# Patient Record
Sex: Female | Born: 1955 | Race: White | Hispanic: No | Marital: Married | Smoking: Never smoker
Health system: Southern US, Community
[De-identification: ages and names within clinical notes are randomized; demographics above are authoritative.]

## PROBLEM LIST (undated history)

## (undated) DIAGNOSIS — E119 Type 2 diabetes mellitus without complications: Secondary | ICD-10-CM

## (undated) DIAGNOSIS — E785 Hyperlipidemia, unspecified: Secondary | ICD-10-CM

## (undated) DIAGNOSIS — K219 Gastro-esophageal reflux disease without esophagitis: Secondary | ICD-10-CM

## (undated) DIAGNOSIS — G4730 Sleep apnea, unspecified: Secondary | ICD-10-CM

## (undated) DIAGNOSIS — H8109 Meniere's disease, unspecified ear: Secondary | ICD-10-CM

## (undated) DIAGNOSIS — M1990 Unspecified osteoarthritis, unspecified site: Secondary | ICD-10-CM

## (undated) DIAGNOSIS — J45909 Unspecified asthma, uncomplicated: Secondary | ICD-10-CM

## (undated) HISTORY — PX: WRIST ARTHROSCOPY: SHX838

## (undated) HISTORY — PX: BREAST EXCISIONAL BIOPSY: SUR124

## (undated) HISTORY — PX: BREAST SURGERY: SHX581

## (undated) HISTORY — PX: CHOLECYSTECTOMY: SHX55

## (undated) HISTORY — PX: ANKLE FRACTURE SURGERY: SHX122

## (undated) HISTORY — DX: Meniere's disease, unspecified ear: H81.09

## (undated) HISTORY — PX: ABDOMINAL HYSTERECTOMY: SHX81

## (undated) HISTORY — PX: TONSILLECTOMY: SUR1361

## (undated) HISTORY — DX: Hyperlipidemia, unspecified: E78.5

---

## 2004-06-03 ENCOUNTER — Ambulatory Visit: Payer: Self-pay | Admitting: Family Medicine

## 2004-09-28 ENCOUNTER — Ambulatory Visit: Payer: Self-pay | Admitting: Obstetrics and Gynecology

## 2005-08-02 ENCOUNTER — Ambulatory Visit: Payer: Self-pay | Admitting: Unknown Physician Specialty

## 2006-08-04 ENCOUNTER — Ambulatory Visit: Payer: Self-pay | Admitting: Unknown Physician Specialty

## 2007-11-21 ENCOUNTER — Ambulatory Visit: Payer: Self-pay | Admitting: Unknown Physician Specialty

## 2008-06-04 ENCOUNTER — Ambulatory Visit: Payer: Self-pay | Admitting: Unknown Physician Specialty

## 2008-11-26 ENCOUNTER — Ambulatory Visit: Payer: Self-pay | Admitting: Unknown Physician Specialty

## 2009-11-18 ENCOUNTER — Ambulatory Visit: Payer: Self-pay | Admitting: Unknown Physician Specialty

## 2009-11-27 ENCOUNTER — Ambulatory Visit: Payer: Self-pay | Admitting: Unknown Physician Specialty

## 2009-12-10 ENCOUNTER — Ambulatory Visit: Payer: Self-pay | Admitting: Unknown Physician Specialty

## 2010-01-09 ENCOUNTER — Ambulatory Visit: Payer: Self-pay | Admitting: Unknown Physician Specialty

## 2010-02-09 ENCOUNTER — Ambulatory Visit: Payer: Self-pay | Admitting: Unknown Physician Specialty

## 2010-04-15 ENCOUNTER — Ambulatory Visit: Payer: Self-pay | Admitting: Unknown Physician Specialty

## 2010-04-22 ENCOUNTER — Ambulatory Visit: Payer: Self-pay

## 2010-04-29 ENCOUNTER — Ambulatory Visit: Payer: Self-pay | Admitting: Surgery

## 2010-05-21 ENCOUNTER — Ambulatory Visit: Payer: Self-pay | Admitting: Surgery

## 2010-05-28 ENCOUNTER — Ambulatory Visit: Payer: Self-pay | Admitting: Surgery

## 2010-11-02 ENCOUNTER — Ambulatory Visit: Payer: Self-pay | Admitting: Gastroenterology

## 2010-11-02 LAB — HM COLONOSCOPY

## 2010-11-30 ENCOUNTER — Ambulatory Visit: Payer: Self-pay | Admitting: Unknown Physician Specialty

## 2011-05-24 IMAGING — NM NUCLEAR MEDICINE HEPATOHBILIARY INCLUDE GB
3 series · 21 of 21 positions shown · non-contrast
Comparison: none

REASON FOR EXAM: abd pain
COMMENTS:

[Series 1000: gallbladder statics · 4.80mm/px · 9 of 9 slices shown]
[im 1/9]
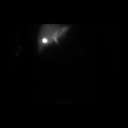
[im 2/9]
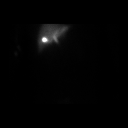
[im 3/9]
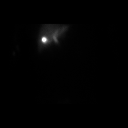
[im 4/9]
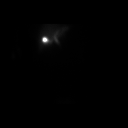
[im 5/9]
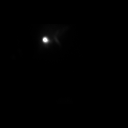
[im 6/9]
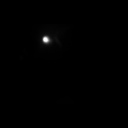
[im 7/9]
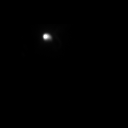
[im 8/9]
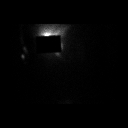
[im 9/9]
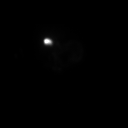

[Series 1000: gallbladder dynamic (results) · 4.80mm/px · 6 of 60 frames shown]
[frame 6/60]
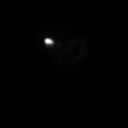
[frame 16/60]
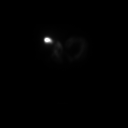
[frame 26/60]
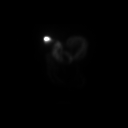
[frame 36/60]
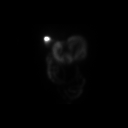
[frame 46/60]
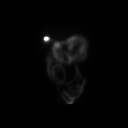
[frame 56/60]
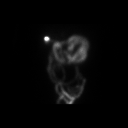

[Series 1000: gallbladder dynamic · 4.80mm/px · 6 of 60 frames shown]
[frame 6/60]
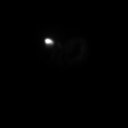
[frame 16/60]
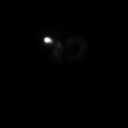
[frame 26/60]
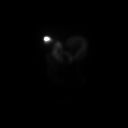
[frame 36/60]
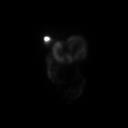
[frame 46/60]
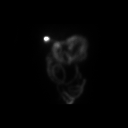
[frame 56/60]
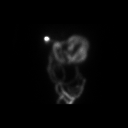

[21 of 21 positions shown; findings below may reference images not displayed]

PROCEDURE:     NM  - NM HEPATO WITH GB EJECT FRACTION  - April 29, 2010 [DATE]

RESULT:     Following intravenous administration of 8.16 mCi technetium-66m
Choletec, there is noted prompt visualization of tracer activity in the
liver at 8 minutes. Tracer activity is visualized in the gallbladder, common
duct and small bowel at 72 minutes. The gallbladder ejection fraction
measures 92%, which is in the normal range.
IMPRESSION: 1. Normal hepatobiliary scan.
2. The gallbladder ejection fraction measures 92% which is in the normal
range.

## 2011-12-01 ENCOUNTER — Ambulatory Visit: Payer: Self-pay | Admitting: Unknown Physician Specialty

## 2012-08-07 ENCOUNTER — Ambulatory Visit: Payer: Self-pay | Admitting: Unknown Physician Specialty

## 2012-08-12 ENCOUNTER — Ambulatory Visit: Payer: Self-pay | Admitting: Unknown Physician Specialty

## 2017-08-10 ENCOUNTER — Other Ambulatory Visit: Payer: Self-pay

## 2017-08-10 ENCOUNTER — Observation Stay
Admission: EM | Admit: 2017-08-10 | Discharge: 2017-08-11 | DRG: 638 | Disposition: A | Discharge status: Home / Self Care | Payer: No Typology Code available for payment source | Attending: Specialist | Admitting: Specialist

## 2017-08-10 ENCOUNTER — Encounter: Payer: Self-pay | Admitting: Emergency Medicine

## 2017-08-10 DIAGNOSIS — Z794 Long term (current) use of insulin: Secondary | ICD-10-CM

## 2017-08-10 DIAGNOSIS — R42 Dizziness and giddiness: Secondary | ICD-10-CM | POA: Diagnosis not present

## 2017-08-10 DIAGNOSIS — E871 Hypo-osmolality and hyponatremia: Secondary | ICD-10-CM | POA: Diagnosis present

## 2017-08-10 DIAGNOSIS — Z91120 Patient's intentional underdosing of medication regimen due to financial hardship: Secondary | ICD-10-CM

## 2017-08-10 DIAGNOSIS — Z9049 Acquired absence of other specified parts of digestive tract: Secondary | ICD-10-CM | POA: Diagnosis not present

## 2017-08-10 DIAGNOSIS — E119 Type 2 diabetes mellitus without complications: Secondary | ICD-10-CM

## 2017-08-10 DIAGNOSIS — T383X6A Underdosing of insulin and oral hypoglycemic [antidiabetic] drugs, initial encounter: Secondary | ICD-10-CM | POA: Diagnosis present

## 2017-08-10 DIAGNOSIS — Z90710 Acquired absence of both cervix and uterus: Secondary | ICD-10-CM | POA: Diagnosis not present

## 2017-08-10 DIAGNOSIS — Z801 Family history of malignant neoplasm of trachea, bronchus and lung: Secondary | ICD-10-CM

## 2017-08-10 DIAGNOSIS — E663 Overweight: Secondary | ICD-10-CM | POA: Diagnosis present

## 2017-08-10 DIAGNOSIS — Z6825 Body mass index (BMI) 25.0-25.9, adult: Secondary | ICD-10-CM

## 2017-08-10 DIAGNOSIS — E860 Dehydration: Secondary | ICD-10-CM | POA: Diagnosis present

## 2017-08-10 DIAGNOSIS — E785 Hyperlipidemia, unspecified: Secondary | ICD-10-CM | POA: Diagnosis not present

## 2017-08-10 DIAGNOSIS — R55 Syncope and collapse: Secondary | ICD-10-CM | POA: Diagnosis present

## 2017-08-10 DIAGNOSIS — E1010 Type 1 diabetes mellitus with ketoacidosis without coma: Principal | ICD-10-CM | POA: Diagnosis present

## 2017-08-10 DIAGNOSIS — E1110 Type 2 diabetes mellitus with ketoacidosis without coma: Secondary | ICD-10-CM | POA: Diagnosis present

## 2017-08-10 HISTORY — DX: Type 2 diabetes mellitus without complications: E11.9

## 2017-08-10 LAB — COMPREHENSIVE METABOLIC PANEL
ALT: 31 U/L (ref 14–54)
AST: 30 U/L (ref 15–41)
Albumin: 4.4 g/dL (ref 3.5–5.0)
Alkaline Phosphatase: 122 U/L (ref 38–126)
Anion gap: 21 — ABNORMAL HIGH (ref 5–15)
BILIRUBIN TOTAL: 1.3 mg/dL — AB (ref 0.3–1.2)
BUN: 23 mg/dL — ABNORMAL HIGH (ref 6–20)
CALCIUM: 9.9 mg/dL (ref 8.9–10.3)
CO2: 13 mmol/L — ABNORMAL LOW (ref 22–32)
CREATININE: 0.95 mg/dL (ref 0.44–1.00)
Chloride: 98 mmol/L — ABNORMAL LOW (ref 101–111)
Glucose, Bld: 496 mg/dL — ABNORMAL HIGH (ref 65–99)
Potassium: 4.8 mmol/L (ref 3.5–5.1)
Sodium: 132 mmol/L — ABNORMAL LOW (ref 135–145)
TOTAL PROTEIN: 8.1 g/dL (ref 6.5–8.1)

## 2017-08-10 LAB — CBC WITH DIFFERENTIAL/PLATELET
BASOS PCT: 1 %
Basophils Absolute: 0.1 10*3/uL (ref 0–0.1)
EOS ABS: 0.1 10*3/uL (ref 0–0.7)
Eosinophils Relative: 1 %
HCT: 45.9 % (ref 35.0–47.0)
HEMOGLOBIN: 15 g/dL (ref 12.0–16.0)
Lymphocytes Relative: 22 %
Lymphs Abs: 1.8 10*3/uL (ref 1.0–3.6)
MCH: 30.6 pg (ref 26.0–34.0)
MCHC: 32.7 g/dL (ref 32.0–36.0)
MCV: 93.6 fL (ref 80.0–100.0)
MONOS PCT: 6 %
Monocytes Absolute: 0.5 10*3/uL (ref 0.2–0.9)
NEUTROS PCT: 70 %
Neutro Abs: 5.9 10*3/uL (ref 1.4–6.5)
Platelets: 288 10*3/uL (ref 150–440)
RBC: 4.91 MIL/uL (ref 3.80–5.20)
RDW: 13.7 % (ref 11.5–14.5)
WBC: 8.4 10*3/uL (ref 3.6–11.0)

## 2017-08-10 LAB — URINALYSIS, COMPLETE (UACMP) WITH MICROSCOPIC
Bacteria, UA: NONE SEEN
Bilirubin Urine: NEGATIVE
Hgb urine dipstick: NEGATIVE
Ketones, ur: 80 mg/dL — AB
Leukocytes, UA: NEGATIVE
Nitrite: NEGATIVE
PH: 5 (ref 5.0–8.0)
PROTEIN: NEGATIVE mg/dL
RBC / HPF: NONE SEEN RBC/hpf (ref 0–5)
SPECIFIC GRAVITY, URINE: 1.025 (ref 1.005–1.030)

## 2017-08-10 LAB — BASIC METABOLIC PANEL
Anion gap: 11 (ref 5–15)
Anion gap: 5 (ref 5–15)
Anion gap: 10 (ref 5–15)
BUN: 17 mg/dL (ref 6–20)
BUN: 13 mg/dL (ref 6–20)
BUN: 13 mg/dL (ref 6–20)
CO2: 14 mmol/L — ABNORMAL LOW (ref 22–32)
CO2: 18 mmol/L — ABNORMAL LOW (ref 22–32)
CO2: 17 mmol/L — ABNORMAL LOW (ref 22–32)
Calcium: 8.3 mg/dL — ABNORMAL LOW (ref 8.9–10.3)
Calcium: 8.2 mg/dL — ABNORMAL LOW (ref 8.9–10.3)
Calcium: 8.5 mg/dL — ABNORMAL LOW (ref 8.9–10.3)
Chloride: 107 mmol/L (ref 101–111)
Chloride: 111 mmol/L (ref 101–111)
Chloride: 110 mmol/L (ref 101–111)
Creatinine, Ser: 0.78 mg/dL (ref 0.44–1.00)
Creatinine, Ser: 0.63 mg/dL (ref 0.44–1.00)
Creatinine, Ser: 0.75 mg/dL (ref 0.44–1.00)
GFR calc Af Amer: >60 mL/min (ref >60)
GFR calc Af Amer: >60 mL/min (ref >60)
GFR calc Af Amer: >60 mL/min (ref >60)
GFR calc non Af Amer: >60 mL/min (ref >60)
GFR calc non Af Amer: >60 mL/min (ref >60)
GFR calc non Af Amer: >60 mL/min (ref >60)
Glucose, Bld: 249 mg/dL — ABNORMAL HIGH (ref 65–99)
Glucose, Bld: 149 mg/dL — ABNORMAL HIGH (ref 65–99)
Glucose, Bld: 346 mg/dL — ABNORMAL HIGH (ref 65–99)
Potassium: 5 mmol/L (ref 3.5–5.1)
Potassium: 4 mmol/L (ref 3.5–5.1)
Potassium: 4.6 mmol/L (ref 3.5–5.1)
Sodium: 132 mmol/L — ABNORMAL LOW (ref 135–145)
Sodium: 134 mmol/L — ABNORMAL LOW (ref 135–145)
Sodium: 137 mmol/L (ref 135–145)

## 2017-08-10 LAB — GLUCOSE, CAPILLARY
GLUCOSE-CAPILLARY: 288 mg/dL — AB (ref 65–99)
Glucose-Capillary: 371 mg/dL — ABNORMAL HIGH (ref 65–99)
Glucose-Capillary: 263 mg/dL — ABNORMAL HIGH (ref 65–99)
Glucose-Capillary: 263 mg/dL — ABNORMAL HIGH (ref 65–99)
Glucose-Capillary: 208 mg/dL — ABNORMAL HIGH (ref 65–99)
Glucose-Capillary: 197 mg/dL — ABNORMAL HIGH (ref 65–99)
Glucose-Capillary: 174 mg/dL — ABNORMAL HIGH (ref 65–99)
Glucose-Capillary: 139 mg/dL — ABNORMAL HIGH (ref 65–99)
Glucose-Capillary: 124 mg/dL — ABNORMAL HIGH (ref 65–99)
Glucose-Capillary: 361 mg/dL — ABNORMAL HIGH (ref 65–99)

## 2017-08-10 LAB — BETA-HYDROXYBUTYRIC ACID: Beta-Hydroxybutyric Acid: 7.49 mmol/L — ABNORMAL HIGH (ref 0.05–0.27)

## 2017-08-10 LAB — HEMOGLOBIN A1C
Hgb A1c MFr Bld: 13.4 % — ABNORMAL HIGH (ref 4.8–5.6)
Mean Plasma Glucose: 337.88 mg/dL

## 2017-08-10 MED ORDER — SODIUM CHLORIDE 0.9 % IV SOLN
INTRAVENOUS | Status: DC
  Administered 2017-08-10 (×2): via INTRAVENOUS

## 2017-08-10 MED ORDER — SODIUM CHLORIDE 0.9 % IV BOLUS (SEPSIS)
1000.0000 mL | Freq: Once | INTRAVENOUS | Status: AC
Start: 2017-08-10 — End: 2017-08-10
  Administered 2017-08-10: 1000 mL via INTRAVENOUS

## 2017-08-10 MED ORDER — SODIUM CHLORIDE 0.9 % IV SOLN
INTRAVENOUS | Status: DC
  Administered 2017-08-10: 4.1 [IU]/h via INTRAVENOUS

## 2017-08-10 MED ORDER — SODIUM CHLORIDE 0.9 % IV BOLUS (SEPSIS)
1000.0000 mL | Freq: Once | INTRAVENOUS | Status: AC
  Administered 2017-08-10: 1000 mL via INTRAVENOUS

## 2017-08-10 MED ORDER — POTASSIUM CHLORIDE 10 MEQ/100ML IV SOLN
10.0000 meq | INTRAVENOUS | Status: AC
  Filled 2017-08-10 (×2): qty 100

## 2017-08-10 MED ORDER — SODIUM CHLORIDE 0.9 % IV SOLN
INTRAVENOUS | Status: DC
  Administered 2017-08-10 (×2): via INTRAVENOUS

## 2017-08-10 MED ORDER — ONDANSETRON HCL 4 MG/2ML IJ SOLN
4.0000 mg | Freq: Once | INTRAMUSCULAR | Status: AC
  Administered 2017-08-10: 4 mg via INTRAVENOUS
  Filled 2017-08-10: qty 2

## 2017-08-10 MED ORDER — POTASSIUM CHLORIDE 10 MEQ/100ML IV SOLN
10.0000 meq | INTRAVENOUS | Status: AC
  Administered 2017-08-10 (×2): 10 meq via INTRAVENOUS
  Filled 2017-08-10 (×2): qty 100

## 2017-08-10 MED ORDER — ENOXAPARIN SODIUM 40 MG/0.4ML ~~LOC~~ SOLN
40.0000 mg | SUBCUTANEOUS | Status: DC
  Administered 2017-08-11: 40 mg via SUBCUTANEOUS
  Filled 2017-08-10: qty 0.4

## 2017-08-10 MED ORDER — INSULIN GLARGINE 100 UNIT/ML ~~LOC~~ SOLN
20.0000 [IU] | Freq: Every day | SUBCUTANEOUS | Status: DC
  Administered 2017-08-10 – 2017-08-11 (×2): 20 [IU] via SUBCUTANEOUS
  Filled 2017-08-10 (×2): qty 0.2

## 2017-08-10 MED ORDER — DEXTROSE-NACL 5-0.45 % IV SOLN
INTRAVENOUS | Status: DC
  Administered 2017-08-10: 09:00:00 via INTRAVENOUS

## 2017-08-10 MED ORDER — INSULIN ASPART 100 UNIT/ML ~~LOC~~ SOLN
0.0000 [IU] | Freq: Three times a day (TID) | SUBCUTANEOUS | Status: DC
  Administered 2017-08-10: 15 [IU] via SUBCUTANEOUS
  Administered 2017-08-11 (×2): 5 [IU] via SUBCUTANEOUS
  Filled 2017-08-10 (×3): qty 1

## 2017-08-10 MED ORDER — SODIUM CHLORIDE 0.9 % IV SOLN
INTRAVENOUS | Status: AC
  Administered 2017-08-10: 09:00:00 via INTRAVENOUS

## 2017-08-10 MED ORDER — SODIUM CHLORIDE 0.9 % IV SOLN
INTRAVENOUS | Status: DC
  Administered 2017-08-10: 3.1 [IU]/h via INTRAVENOUS
  Filled 2017-08-10: qty 1

## 2017-08-10 NOTE — H&P (Signed)
Lindsey Hendricks is an 62 y.o. female.   Chief Complaint: Near syncope HPI: The patient with past medical history of diabetes presents to the emergency department with near syncope.  The patient was visiting her husband here in the hospital when she nearly fainted.  She checked her blood sugar was found to be more than 500.  The patient admits that she is not been able to her for insulin in more than 3 months.  Laboratory evaluation revealed increased anion gap as well as acidosis consistent with diabetic ketoacidosis which prompted the emergency department staff to call the hospitalist service for further management.  Past Medical History:  Diagnosis Date  . Diabetes mellitus without complication [facility])     Past Surgical History:  Procedure Laterality Date  . ABDOMINAL HYSTERECTOMY    . BREAST SURGERY    . CHOLECYSTECTOMY      Family History  Problem Relation Age of Onset  . Lung cancer Father    Social History:  reports that  has never smoked. she has never used smokeless tobacco. She reports that she does not drink alcohol or use drugs.  Allergies: No Known Allergies  Prior to Admission medications   Not on File   The patient takes insulin but has not had prescribed medication in more than 3 months.   Results for orders placed or performed during the hospital encounter of 08/10/17 (from the past 48 hour(s))  Comprehensive metabolic panel     Status: Abnormal   Collection Time: 08/10/17  4:17 AM  Result Value Ref Range   Sodium 132 (L) 135 - 145 mmol/L    Comment: LYTES REPEATED. JAG   Potassium 4.8 3.5 - 5.1 mmol/L   Chloride 98 (L) 101 - 111 mmol/L   CO2 13 (L) 22 - 32 mmol/L   Glucose, Bld 496 (H) 65 - 99 mg/dL   BUN 23 (H) 6 - 20 mg/dL   Creatinine, Ser 0.95 0.44 - 1.00 mg/dL   Calcium 9.9 8.9 - 10.3 mg/dL   Total Protein 8.1 6.5 - 8.1 g/dL   Albumin 4.4 3.5 - 5.0 g/dL   AST 30 15 - 41 U/L   ALT 31 14 - 54 U/L   Alkaline Phosphatase 122 38 - 126 U/L   Total  Bilirubin 1.3 (H) 0.3 - 1.2 mg/dL   GFR calc non Af Amer >60 >60 mL/min   GFR calc Af Amer >60 >60 mL/min    Comment: (NOTE) The eGFR has been calculated using the CKD EPI equation. This calculation has not been validated in all clinical situations. eGFR's persistently <60 mL/min signify possible Chronic Kidney Disease.    Anion gap 21 (H) 5 - 15    Comment: Performed at [facility], [address] [place], [place] [address]  CBC with Differential     Status: None   Collection Time: 08/10/17  4:17 AM  Result Value Ref Range   WBC 8.4 3.6 - 11.0 K/uL   RBC 4.91 3.80 - 5.20 MIL/uL   Hemoglobin 15.0 12.0 - 16.0 g/dL   HCT 45.9 35.0 - 47.0 %   MCV 93.6 80.0 - 100.0 fL   MCH 30.6 26.0 - 34.0 pg   MCHC 32.7 32.0 - 36.0 g/dL   RDW 13.7 11.5 - 14.5 %   Platelets 288 150 - 440 K/uL   Neutrophils Relative % 70 %   Neutro Abs 5.9 1.4 - 6.5 K/uL   Lymphocytes Relative 22 %   Lymphs Abs 1.8  1.0 - 3.6 K/uL   Monocytes Relative 6 %   Monocytes Absolute 0.5 0.2 - 0.9 K/uL   Eosinophils Relative 1 %   Eosinophils Absolute 0.1 0 - 0.7 K/uL   Basophils Relative 1 %   Basophils Absolute 0.1 0 - 0.1 K/uL    Comment: Performed at [facility], [address] [place], [place] [address]  Beta-hydroxybutyric acid     Status: Abnormal   Collection Time: 08/10/17  4:17 AM  Result Value Ref Range   Beta-Hydroxybutyric Acid 7.49 (H) 0.05 - 0.27 mmol/L    Comment: RESULT CONFIRMED BY MANUAL DILUTION. JAG Performed at [facility], [address] [place], [place] [address]   Blood gas, venous     Status: Abnormal (Preliminary result)   Collection Time: 08/10/17  5:18 AM  Result Value Ref Range   pH, Ven 7.12 (LL) 7.250 - 7.430    Comment: CRITICAL RESULT, NOTIFIED PHYSICIAN [place] [id number] SKM    pCO2, Ven 45 44.0 - 60.0 mmHg   pO2, Ven PENDING 32.0 - 45.0 mmHg   Bicarbonate 14.6 (L) 20.0 - 28.0 mmol/L   Acid-base deficit 14.5 (H) 0.0 - 2.0 mmol/L    Patient temperature 37.0    Collection site VENOUS    Sample type VENOUS     Comment: Performed at [facility], [address] [place], [place] [address]  Urinalysis, Complete w Microscopic     Status: Abnormal   Collection Time: 08/10/17  5:32 AM  Result Value Ref Range   Color, Urine STRAW (A) YELLOW   APPearance CLEAR (A) CLEAR   Specific Gravity, Urine 1.025 1.005 - 1.030   pH 5.0 5.0 - 8.0   Glucose, UA >=500 (A) NEGATIVE mg/dL   Hgb urine dipstick NEGATIVE NEGATIVE   Bilirubin Urine NEGATIVE NEGATIVE   Ketones, ur 80 (A) NEGATIVE mg/dL   Protein, ur NEGATIVE NEGATIVE mg/dL   Nitrite NEGATIVE NEGATIVE   Leukocytes, UA NEGATIVE NEGATIVE   RBC / HPF NONE SEEN 0 - 5 RBC/hpf   WBC, UA 0-5 0 - 5 WBC/hpf   Bacteria, UA NONE SEEN NONE SEEN   Squamous Epithelial / LPF 0-5 (A) NONE SEEN   Mucus PRESENT     Comment: Performed at [facility], [address] [place],  [address]  Glucose, capillary     Status: Abnormal   Collection Time: 08/10/17  6:04 AM  Result Value Ref Range   Glucose-Capillary 371 (H) 65 - 99 mg/dL   No results found.  Review of Systems  Constitutional: Positive for malaise/fatigue. Negative for chills and fever.  HENT: Negative for sore throat and tinnitus.   Eyes: Negative for blurred vision and redness.  Respiratory: Negative for cough and shortness of breath.   Cardiovascular: Negative for chest pain, palpitations, orthopnea and PND.  Gastrointestinal: Positive for nausea and vomiting. Negative for abdominal pain and diarrhea.  Genitourinary: Negative for dysuria, frequency and urgency.  Musculoskeletal: Negative for joint pain and myalgias.  Skin: Negative for rash.       No lesions  Neurological: Negative for speech change and focal weakness.  Endo/Heme/Allergies: Does not bruise/bleed easily.       No temperature intolerance  Psychiatric/Behavioral: Negative for depression and suicidal ideas.    Blood pressure 127/76,  pulse (!) 113, temperature 98 F (36.7 C), temperature source Oral, resp. rate 16, height '5\' 4"'  (1.626 m), weight 68 kg (150 lb), SpO2 95 %. Physical Exam  Vitals reviewed. Constitutional: She  is oriented to person, place, and time. She appears well-developed and well-nourished. No distress.  HENT:  Head: Normocephalic and atraumatic.  Mouth/Throat: Oropharynx is clear and moist.  Eyes: Conjunctivae and EOM are normal. Pupils are equal, round, and reactive to light. No scleral icterus.  Neck: Normal range of motion. Neck supple. No JVD present. No tracheal deviation present. No thyromegaly present.  Cardiovascular: Normal rate, regular rhythm and normal heart sounds. Exam reveals no gallop and no friction rub.  No murmur heard. Respiratory: Effort normal and breath sounds normal.  GI: Soft. Bowel sounds are normal. She exhibits no distension. There is no tenderness.  Genitourinary:  Genitourinary Comments: Deferred  Musculoskeletal: Normal range of motion. She exhibits no edema.  Lymphadenopathy:    She has no cervical adenopathy.  Neurological: She is alert and oriented to person, place, and time. No cranial nerve deficit. She exhibits normal muscle tone.  Skin: Skin is warm and dry. No rash noted. No erythema.  Psychiatric: She has a normal mood and affect. Her behavior is normal. Judgment and thought content normal.     Assessment/Plan This is a 62 year old female admitted for DKA. 1.  DKA: Insulin drip until anion gap closes at which time start basal insulin and allow patient to eat. 2.  Hyponatremia: Pseudo-; secondary to hyperglycemia.  Continue to hydrate with normal saline.  Add dextrose to intravenous fluid once blood sugar is less than 250. 3.  Overweight: BMI is 25; encouraged healthy diet and exercise. 4.  DVT prophylaxis: Lovenox 5.  GI prophylaxis: None The patient is a full code.  Time spent on admission orders and critical care approximately 45 minutes. Discussed with  [facility] telemedicine  Harrie Foreman, MD 08/10/2017, 7:11 AM

## 2017-08-10 NOTE — Progress Notes (Signed)
[place] at [place] NAME: Lindsey Hendricks    MR#:  [id number]  DATE OF BIRTH:  August 02, 1955  SUBJECTIVE:  Patient presented to the emergency room with near syncopal episode.  She checked her sugar was more than 500.  She has not been on her insulin more than 3 months.  Patient was found to be in DKA. Her labs have improved.  Anion gap is closed.  We will turn off insulin drip and admit patient on regular floor  REVIEW OF SYSTEMS:   Review of Systems  Constitutional: Negative for chills, fever and weight loss.  HENT: Negative for ear discharge, ear pain and nosebleeds.   Eyes: Negative for blurred vision, pain and discharge.  Respiratory: Negative for sputum production, shortness of breath, wheezing and stridor.   Cardiovascular: Negative for chest pain, palpitations, orthopnea and PND.  Gastrointestinal: Negative for abdominal pain, diarrhea, nausea and vomiting.  Genitourinary: Negative for frequency and urgency.  Musculoskeletal: Negative for back pain and joint pain.  Neurological: Positive for weakness. Negative for sensory change, speech change and focal weakness.  Psychiatric/Behavioral: Negative for depression and hallucinations. The patient is not nervous/anxious.     DRUG ALLERGIES:  No Known Allergies  VITALS:  Blood pressure 110/75, pulse 85, temperature 97.9 F (36.6 C), temperature source Oral, resp. rate 16, height 5\' 4"  (1.626 m), weight 68 kg (150 lb), SpO2 96 %.  PHYSICAL EXAMINATION:   Physical Exam  GENERAL:  62 y.o.-year-old patient lying in the bed with no acute distress.  EYES: Pupils equal, round, reactive to light and accommodation. No scleral icterus. Extraocular muscles intact.  HEENT: Head atraumatic, normocephalic. Oropharynx and nasopharynx clear.  Oral mucosa dry nECK:  Supple, no jugular venous distention. No thyroid enlargement, no tenderness.  LUNGS: Normal breath sounds bilaterally, no wheezing,  rales, rhonchi. No use of accessory muscles of respiration.  CARDIOVASCULAR: S1, S2 normal. No murmurs, rubs, or gallops.  ABDOMEN: Soft, nontender, nondistended. Bowel sounds present. No organomegaly or mass.  EXTREMITIES: No cyanosis, clubbing or edema b/l.    NEUROLOGIC: Cranial nerves II through XII are intact. No focal Motor or sensory deficits b/l.   PSYCHIATRIC:  patient is alert and oriented x 3.  SKIN: No obvious rash, lesion, or ulcer.   LABORATORY PANEL:  CBC Recent Labs  Lab 08/10/17 0417  WBC 8.4  HGB 15.0  HCT 45.9  PLT 288    Chemistries  Recent Labs  Lab 08/10/17 0417  08/10/17 1618  NA 132*   < > 137  K 4.8   < > 4.6  CL 98*   < > 110  CO2 13*   < > 17*  GLUCOSE 496*   < > 346*  BUN 23*   < > 13  CREATININE 0.95   < > 0.75  CALCIUM 9.9   < > 8.5*  AST 30  --   --   ALT 31  --   --   ALKPHOS 122  --   --   BILITOT 1.3*  --   --    < > = values in this interval not displayed.   Cardiac Enzymes No results for input(s): TROPONINI in the last 168 hours. RADIOLOGY:  No results found. ASSESSMENT AND PLAN:  62 year old Lindsey Hendricks with past medical history of type 2 diabetes on insulin came to the emergency room with dizziness and weakness.  She had a near syncopal episode.  She did not lose consciousness.  She was found to have sugars more than 500.  Patient came to the emergency room and was found to be in DKA.  1.  DKA:  -Patient was initiated in the emergency room on Insulin drip  -anion gap closes at which time start basal insulin has been initiated and  allow patient to eat. -Continue IV fluids -Diabetes coordinator to see patient in a.m. -Patient was noncompliant to insulin for last 3 months.  She told me she has financial constraints. - care manager to assist with meds if possible  2.  Hyponatremia: Pseudo-; secondary to hyperglycemia.   -Continue to hydrate with normal saline.   -Sodium is corrected  3.   DVT prophylaxis: Lovenox  If remains  stable patient can discharge home tomorrow with outpatient follow-up with endocrinology and establish new primary care physician in the area.  Case discussed with Care Management/Social Worker. Management plans discussed with the patient, family and they are in agreement.  CODE STATUS: Full  DVT Prophylaxis: Lovenox  TOTAL TIME TAKING CARE OF THIS PATIENT: 30 minutes.  >50% time spent on counselling and coordination of care  POSSIBLE D/C IN 1 DAYS, DEPENDING ON CLINICAL CONDITION.  Note: This dictation was prepared with Dragon dictation along with smaller phrase technology. Any transcriptional errors that result from this process are unintentional.  Lindsey Hendricks M.D on 08/10/2017 at 5:45 PM  Between 7am to 6pm - Pager - [phone]  After 6pm go to [email or web address] - password [email or web address] [facility]  Office  [phone]  CC: Primary care physician; Patient, No Pcp PerPatient ID: Lindsey Hendricks, female   DOB: 06/28/1956, 62 y.o.   MRN: [id number]

## 2017-08-10 NOTE — ED Notes (Signed)
Spoke with Ashby Dawes, Diabetes Coordinator regarding patient's glucostabilizer. She reports she will watch for results from 1200 blood draw and call back with next step to take.

## 2017-08-10 NOTE — ED Notes (Signed)
Patient ambulatory to restroom. Patient placed in hospital bed and given warm blanket.

## 2017-08-10 NOTE — ED Triage Notes (Signed)
Pt arrived to ED via wheelchair from [place] where RN reports pt had near syncopal episode and a CBG reading >500. Pt is pale in color but able to answer questions. Pt reports she has not been able to get DM medication x3 months due to lack of insurance. MD at bedside for further evaluation.

## 2017-08-10 NOTE — ED Notes (Signed)
CCU called and unable to take patient at this time. Report they had to use last bed for floor patient. Patient and charge nurse made aware. AC called to obtain hospital bed for patient if possible.

## 2017-08-10 NOTE — Progress Notes (Signed)
MD- Please consider the following:  1. Increase Lantus to 30 units daily (50% total home dose from when pt was regularly taking insulin over 3 months ago)  2. Start Novolog Meal Coverage: Novolog 4 units TID with meals (hold if pt eats <50% of meal)   3. Patient will need the following Rxs at time of discharge: Lantus vial- Order # [id number] Novolog vial- Order # [id number] Insulin Syringes- Order # [id number]    Spoke with pt this afternoon.  Pt stated she lost her insurance 3 months ago when she moved to [facility] from [place].  Was taking Lantus 30 units BID + Novolog 1 unit for every 12 grams Carbohydrates + Novolog SSI.  Lost insurance and could no longer afford insulin.  Patient told me her insurance got restarted this Monday.  Looking to get established with PCP or Endocrinologist here in town.  Knows the importance of taking insulin and good CBG control.  Just had issues with insurance and access to meds.  Now that she has insurance, plans to be more meticulous about her CBG control.  Has CBG meter and supplies at home.  Uses [employer] CBG meter for cost purposes and buys CBG meter supplies OTC.  Patient stated she has coupon cards for Lantus and Novolog that she can use with her insurance.  Plans to see if her pharmacy in [place] can transfer the coupons to the [facility] here in town so she can get Lantus and Novolog for lower price.    --Will follow patient during hospitalization--  Wyn Quaker RN, MSN, CDE Diabetes Coordinator Inpatient Glycemic Control Team Team Pager: [phone] (8a-5p)

## 2017-08-10 NOTE — Progress Notes (Signed)
Late Entry:  Spoke with Mickel Baas, RN around 2pm by phone.  Note that BMET showed improvement.   Dr. Posey Pronto placed orders to transition to Lantus and Novolog.  Discussed with RN how to properly transition to SQ insulin (give Lantus and then wait to turn off drip 1 hour after Lantus given).  Start Novolog with dinner.  Will have DM Coordinator follow-up with pt tomorrow.    --Will follow patient during hospitalization--  Wyn Quaker RN, MSN, CDE Diabetes Coordinator Inpatient Glycemic Control Team Team Pager: [phone] (8a-5p)

## 2017-08-10 NOTE — ED Notes (Signed)
Patient ambulated to restroom with steady gait. NAD noted. Reports decreased nause.

## 2017-08-10 NOTE — Progress Notes (Addendum)
Inpatient Diabetes Program Recommendations  AACE/ADA: New Consensus Statement on Inpatient Glycemic Control (2015)  Target Ranges:  Prepandial:   less than 140 mg/dL      Peak postprandial:   less than 180 mg/dL (1-2 hours)      Critically ill patients:  140 - 180 mg/dL   Results for HARGUN, SPURLING (MRN [id number]) as of 08/10/2017 08:52  Ref. Range 08/10/2017 04:17  Sodium Latest Ref Range: 135 - 145 mmol/L 132 (L)  Potassium Latest Ref Range: 3.5 - 5.1 mmol/L 4.8  Chloride Latest Ref Range: 101 - 111 mmol/L 98 (L)  CO2 Latest Ref Range: 22 - 32 mmol/L 13 (L)  Glucose Latest Ref Range: 65 - 99 mg/dL 496 (H)  BUN Latest Ref Range: 6 - 20 mg/dL 23 (H)  Creatinine Latest Ref Range: 0.44 - 1.00 mg/dL 0.95  Calcium Latest Ref Range: 8.9 - 10.3 mg/dL 9.9  Anion gap Latest Ref Range: 5 - 15  21 (H)    Admit with: DKA  History: DM  Home DM Meds: Unsure- Need to verify with patient.  Per MD notes, pt has been out of insulin for more than 3 months  Current Insulin Orders: IV Insulin drip     Per MD H&P note: "patient was visiting her husband here in the hospital when she nearly fainted.  She checked her blood sugar was found to be more than 500.  The patient admits that she is not been able to afford insulin in more than 3 months"  Still down in the ED as of 9am today.  Plan to meet with patient once she has a bed assignment to determine what insulins she was taking prior to running out 3 months ago.  Will place Care Management consult since pt currently listed as uninsured.     --Will follow patient during hospitalization--  Wyn Quaker RN, MSN, CDE Diabetes Coordinator Inpatient Glycemic Control Team Team Pager: [phone] (8a-5p)

## 2017-08-10 NOTE — ED Provider Notes (Signed)
[facility] Emergency Department Provider Note  ____________________________________________   First MD Initiated Contact with Patient 08/10/17 [phone]     (approximate)  I have reviewed the triage vital signs and the nursing notes.   HISTORY  Chief Complaint Near Syncope   HPI Lindsey Hendricks is a 62 y.o. female who comes to the emergency department with a near syncopal episode.  She was staying upstairs visiting her husband who is currently admitted to the hospital for CHF exacerbation when she began to feel nauseated lightheaded and like she might pass out.  Nursing staff from upstairs brought her immediately to the emergency department.  The patient has a past medical history of insulin-dependent diabetes mellitus however she has been unable to afford her insulin for the past 3 months.  She reports gradual onset slowly progressive now severe polyuria polydipsia fatigue lightheadedness and upper abdominal discomfort.  Her symptoms are slightly improved when she borrows her friend's insulin and worsened when she is out of it.  Past Medical History:  Diagnosis Date  . Diabetes mellitus without complication [facility])     Patient Active Problem List   Diagnosis Date Noted  . DKA (diabetic ketoacidoses) ([place]) 08/10/2017      Prior to Admission medications   Not on File    Allergies Patient has no allergy information on record.  History reviewed. No pertinent family history.  Social History Social History   Tobacco Use  . Smoking status: Never Smoker  . Smokeless tobacco: Never Used  Substance Use Topics  . Alcohol use: No    Frequency: Never  . Drug use: No    Review of Systems Constitutional: No fever/chills Eyes: No visual changes. ENT: No sore throat. Cardiovascular: Denies chest pain. Respiratory: Denies shortness of breath. Gastrointestinal: Positive for abdominal pain.  Positive for nausea, no vomiting.  No diarrhea.  No  constipation. Genitourinary: Negative for dysuria. Musculoskeletal: Negative for back pain. Skin: Negative for rash. Neurological: Negative for headaches, focal weakness or numbness.   ____________________________________________   PHYSICAL EXAM:  VITAL SIGNS: ED Triage Vitals  Enc Vitals Group     BP      Pulse      Resp      Temp      Temp src      SpO2      Weight      Height      Head Circumference      Peak Flow      Pain Score      Pain Loc      Pain Edu?      Excl. in [place]?     Constitutional: Alert and oriented x4 pale and uncomfortable appearing holding a vomit bag.  Ketones on her breath Eyes: PERRL EOMI. Head: Atraumatic. Nose: No congestion/rhinnorhea. Mouth/Throat: No trismus Neck: No stridor.   Cardiovascular: Tachycardic rate, regular rhythm. Grossly normal heart sounds.  Good peripheral circulation. Respiratory: Increased respiratory effort.  No retractions. Lungs CTAB and moving good air Gastrointestinal: Soft nontender Musculoskeletal: No lower extremity edema   Neurologic:  Normal speech and language. No gross focal neurologic deficits are appreciated. Skin: Pale with faint diaphoresis Psychiatric: Somewhat anxious appearing    ____________________________________________   DIFFERENTIAL includes but not limited to  DKA, HHS, hyperglycemia, syncope, near syncope, dehydration ____________________________________________   LABS (all labs ordered are listed, but only abnormal results are displayed)  Labs Reviewed  COMPREHENSIVE METABOLIC PANEL - Abnormal; Notable for the following components:  Result Value   Sodium 132 (*)    Chloride 98 (*)    CO2 13 (*)    Glucose, Bld 496 (*)    BUN 23 (*)    Total Bilirubin 1.3 (*)    Anion gap 21 (*)    All other components within normal limits  BETA-HYDROXYBUTYRIC ACID - Abnormal; Notable for the following components:   Beta-Hydroxybutyric Acid 7.49 (*)    All other components within normal  limits  BLOOD GAS, VENOUS - Abnormal; Notable for the following components:   pH, Ven 7.12 (*)    Bicarbonate 14.6 (*)    Acid-base deficit 14.5 (*)    All other components within normal limits  CBC WITH DIFFERENTIAL/PLATELET  URINALYSIS, COMPLETE (UACMP) WITH MICROSCOPIC    Lab work reviewed by me with a number of abnormalities.  Low pH high anion gap low bicarbonate high glucose and high beta hydroxybutyric acid are all consistent with diabetic ketoacidosis __________________________________________  EKG  ED ECG REPORT I, Darel Hong, the attending physician, personally viewed and interpreted this ECG.  Date: 08/10/2017 EKG Time:  Rate: 104 Rhythm: Sinus tachycardia QRS Axis: normal Intervals: Prolonged QTC ST/T Wave abnormalities: normal Narrative Interpretation: no evidence of acute ischemia  ____________________________________________  RADIOLOGY   ____________________________________________   PROCEDURES  Procedure(s) performed: no  .Critical Care Performed by: Darel Hong, MD Authorized by: Darel Hong, MD   Critical care provider statement:    Critical care time (minutes):  30   Critical care time was exclusive of:  Separately billable procedures and treating other patients   Critical care was necessary to treat or prevent imminent or life-threatening deterioration of the following conditions:  Endocrine crisis and metabolic crisis   Critical care was time spent personally by me on the following activities:  Development of treatment plan with patient or surrogate, discussions with consultants, evaluation of patient's response to treatment, examination of patient, obtaining history from patient or surrogate, ordering and performing treatments and interventions, ordering and review of laboratory studies, ordering and review of radiographic studies, pulse oximetry, re-evaluation of patient's condition and review of old charts    Critical Care  performed: Yes  Observation: no ____________________________________________   INITIAL IMPRESSION / ASSESSMENT AND PLAN / ED COURSE  Pertinent labs & imaging results that were available during my care of the patient were reviewed by me and considered in my medical decision making (see chart for details).  On arrival the patient is pale, tachycardic, tachypneic, heavy ketones on her breath, and hyperglycemic.  Broad labs are pending but we will give her fluid resuscitation in the meantime.  The patient's labs are consistent with significant dehydration and diabetic ketoacidosis.  I will continue fluid resuscitation with a second liter of normal saline but the patient will require inpatient admission for an insulin drip and close titration of her blood glucose.  I discussed with the patient who verbalized understanding and agreement with the plan.  I then discussed with the hospitalist Dr. Marcille Blanco who is graciously agreed to admit the patient to his service.      ____________________________________________   FINAL CLINICAL IMPRESSION(S) / ED DIAGNOSES  Final diagnoses:  Diabetic ketoacidosis without coma associated with type 1 diabetes mellitus (HCC)      NEW MEDICATIONS STARTED DURING THIS VISIT:  New Prescriptions   No medications on file     Note:  This document was prepared using Dragon voice recognition software and may include unintentional dictation errors.     Darel Hong,  MD 08/10/17 [id number]

## 2017-08-11 LAB — BASIC METABOLIC PANEL
Anion gap: 7 (ref 5–15)
BUN: 10 mg/dL (ref 6–20)
CALCIUM: 8.5 mg/dL — AB (ref 8.9–10.3)
CHLORIDE: 111 mmol/L (ref 101–111)
CO2: 19 mmol/L — AB (ref 22–32)
CREATININE: 0.61 mg/dL (ref 0.44–1.00)
GFR calc Af Amer: >60 mL/min (ref >60)
GFR calc non Af Amer: >60 mL/min (ref >60)
GLUCOSE: 262 mg/dL — AB (ref 65–99)
Potassium: 4.1 mmol/L (ref 3.5–5.1)
Sodium: 137 mmol/L (ref 135–145)

## 2017-08-11 LAB — BLOOD GAS, VENOUS
Acid-base deficit: 14.5 mmol/L — ABNORMAL HIGH (ref 0.0–2.0)
BICARBONATE: 14.6 mmol/L — AB (ref 20.0–28.0)
PCO2 VEN: 45 mmHg (ref 44.0–60.0)
Patient temperature: 37
pH, Ven: 7.12 — CL (ref 7.250–7.430)

## 2017-08-11 LAB — GLUCOSE, CAPILLARY
Glucose-Capillary: 226 mg/dL — ABNORMAL HIGH (ref 65–99)
Glucose-Capillary: 241 mg/dL — ABNORMAL HIGH (ref 65–99)

## 2017-08-11 MED ORDER — ROSUVASTATIN CALCIUM 20 MG PO TABS: 20.0000 mg | ORAL_TABLET | Freq: Every day | ORAL | 0 refills | Status: DC

## 2017-08-11 MED ORDER — INSULIN DETEMIR 100 UNIT/ML FLEXPEN: 60.0000 [IU] | PEN_INJECTOR | Freq: Every day | SUBCUTANEOUS | 0 refills | Status: DC

## 2017-08-11 MED ORDER — INSULIN LISPRO 100 UNIT/ML ~~LOC~~ SOLN: 4.0000 [IU] | Freq: Three times a day (TID) | SUBCUTANEOUS | 0 refills | Status: DC

## 2017-08-11 NOTE — Discharge Summary (Signed)
[facility] NAME: Lindsey Hendricks    MR#:  [id number]  DATE OF BIRTH:  1956-05-02  DATE OF ADMISSION:  08/10/2017 ADMITTING PHYSICIAN: Fritzi Mandes, MD  DATE OF DISCHARGE: 08/11/2017  1:38 PM  PRIMARY CARE PHYSICIAN: Patient, No Pcp Per    ADMISSION DIAGNOSIS:  Diabetic ketoacidosis without coma associated with type 1 diabetes mellitus ([place]) [E10.10] DKA (diabetic ketoacidoses) ([place]) [E13.10]  DISCHARGE DIAGNOSIS:  Active Problems:   DKA (diabetic ketoacidoses) ([place])   SECONDARY DIAGNOSIS:   Past Medical History:  Diagnosis Date  . Diabetes mellitus without complication [facility])     HOSPITAL COURSE:   62 year old female with past medical history of insulin-dependent diabetes, hyperlipidemia, vertigo, who presented to the hospital due to nausea vomiting and noted to be in acute diabetic ketoacidosis.  1.  Acute diabetic ketoacidosis-patient apparently had just moved from [place] to [place] and does not have an established primary care physician and did not have her insulin for the past 3 months and presented with uncontrolled blood sugars and noted to be in DKA. -Patient was treated with IV fluids, insulin drip.  Her anion gap is not closed, she has no nausea vomiting and is tolerating  p.o. intake well. - She was discharged home on her Levemir and Humalog with meals.  she has health insurance now and has a primary care physician establish and will continue follow-up with them as an outpatient. Patient will also resume her metformin upon discharge  2.  Hyperlipidemia-patient will resume her Crestor.  3.  Vertigo-patient will continue her meclizine as needed.  DISCHARGE CONDITIONS:   Stable  CONSULTS OBTAINED:    DRUG ALLERGIES:  No Known Allergies  DISCHARGE MEDICATIONS:   Allergies as of 08/11/2017   No Known Allergies     Medication List    TAKE these medications   Insulin Detemir 100 UNIT/ML Pen Commonly  known as:  LEVEMIR FLEXTOUCH Inject 60 Units into the skin daily.   insulin lispro 100 UNIT/ML injection Commonly known as:  HUMALOG Inject into the skin as directed. Patient reports historically using a sliding scale with adjustments depending on food consumption. Reports typically using about 4-6u per meal What changed:  Another medication with the same name was added. Make sure you understand how and when to take each.   insulin lispro 100 UNIT/ML injection Commonly known as:  HUMALOG Inject 0.04 mLs (4 Units total) into the skin 3 (three) times daily with meals. What changed:  You were already taking a medication with the same name, and this prescription was added. Make sure you understand how and when to take each.   meclizine 25 MG tablet Commonly known as:  ANTIVERT Take 25 mg by mouth 3 (three) times daily as needed for dizziness.   metFORMIN 1000 MG tablet Commonly known as:  GLUCOPHAGE Take 1,000-1,500 mg by mouth 2 (two) times daily. Take 1000MG  by mouth every morning and 1500MG  by mouth every evening   rosuvastatin 20 MG tablet Commonly known as:  CRESTOR Take 1 tablet (20 mg total) by mouth daily.         DISCHARGE INSTRUCTIONS:   DIET:  Diabetic diet  DISCHARGE CONDITION:  Stable  ACTIVITY:  Activity as tolerated  OXYGEN:  Home Oxygen: No.   Oxygen Delivery: room air  DISCHARGE LOCATION:  home   If you experience worsening of your admission symptoms, develop shortness of breath, life threatening emergency, suicidal or homicidal thoughts you must seek  medical attention immediately by calling 911 or calling your MD immediately  if symptoms less severe.  You Must read complete instructions/literature along with all the possible adverse reactions/side effects for all the Medicines you take and that have been prescribed to you. Take any new Medicines after you have completely understood and accpet all the possible adverse reactions/side effects.   Please  note  You were cared for by a hospitalist during your hospital stay. If you have any questions about your discharge medications or the care you received while you were in the hospital after you are discharged, you can call the unit and asked to speak with the hospitalist on call if the hospitalist that took care of you is not available. Once you are discharged, your primary care physician will handle any further medical issues. Please note that NO REFILLS for any discharge medications will be authorized once you are discharged, as it is imperative that you return to your primary care physician (or establish a relationship with a primary care physician if you do not have one) for your aftercare needs so that they can reassess your need for medications and monitor your lab values.     Today   No N/V or any other complaints. AG closed and tolerated PO well. BS stable. Will d/c home today.   VITAL SIGNS:  Blood pressure 134/61, pulse 84, temperature 98.3 F (36.8 C), temperature source Oral, resp. rate 16, height 5\' 4"  (1.626 m), weight 68 kg (150 lb), SpO2 97 %.  I/O:    Intake/Output Summary (Last 24 hours) at 08/11/2017 1731 Last data filed at 08/11/2017 1039 Gross per 24 hour  Intake 1605 ml  Output -  Net 1605 ml    PHYSICAL EXAMINATION:  GENERAL:  62 y.o.-year-old patient lying in the bed with no acute distress.  EYES: Pupils equal, round, reactive to light and accommodation. No scleral icterus. Extraocular muscles intact.  HEENT: Head atraumatic, normocephalic. Oropharynx and nasopharynx clear.  NECK:  Supple, no jugular venous distention. No thyroid enlargement, no tenderness.  LUNGS: Normal breath sounds bilaterally, no wheezing, rales,rhonchi. No use of accessory muscles of respiration.  CARDIOVASCULAR: S1, S2 normal. No murmurs, rubs, or gallops.  ABDOMEN: Soft, non-tender, non-distended. Bowel sounds present. No organomegaly or mass.  EXTREMITIES: No pedal edema, cyanosis, or  clubbing.  NEUROLOGIC: Cranial nerves II through XII are intact. No focal motor or sensory defecits b/l.  PSYCHIATRIC: The patient is alert and oriented x 3.  SKIN: No obvious rash, lesion, or ulcer.   DATA REVIEW:   CBC Recent Labs  Lab 08/10/17 0417  WBC 8.4  HGB 15.0  HCT 45.9  PLT 288    Chemistries  Recent Labs  Lab 08/10/17 0417  08/11/17 0440  NA 132*   < > 137  K 4.8   < > 4.1  CL 98*   < > 111  CO2 13*   < > 19*  GLUCOSE 496*   < > 262*  BUN 23*   < > 10  CREATININE 0.95   < > 0.61  CALCIUM 9.9   < > 8.5*  AST 30  --   --   ALT 31  --   --   ALKPHOS 122  --   --   BILITOT 1.3*  --   --    < > = values in this interval not displayed.    Cardiac Enzymes No results for input(s): TROPONINI in the last 168 hours.  Microbiology Results  No results found for this or any previous visit.  RADIOLOGY:  No results found.    Management plans discussed with the patient, family and they are in agreement.  CODE STATUS:  Code Status History    Date Active Date Inactive Code Status Order ID Comments User Context   08/10/2017 08:22 08/11/2017 16:41 Full Code [id number]  Harrie Foreman, MD ED      TOTAL TIME TAKING CARE OF THIS PATIENT: 40 minutes.    Henreitta Leber M.D on 08/11/2017 at 5:31 PM  Between 7am to 6pm - Pager - [phone]  After 6pm go to [email or web address] - Proofreader  [facility]  Hospitalists  Office  [phone]  CC: Primary care physician; Patient, No Pcp Per

## 2017-08-11 NOTE — Progress Notes (Signed)
Discharge: Pt d/c from room and she went up to 2A where her husband is due to also be discharged from the hospital today.  Discharge instructions given to the patient   No questions from pt, . Pt dressed in street clothes and left with discharge papers and prescriptions in hand. IV d/ced, and no complaints of pain or discomfort.

## 2017-08-11 NOTE — Progress Notes (Signed)
No issues or concerns overnight pnt has slept and appears comfortable. Bed low and locked with call bell in reach. Will continue to monitor and assess.

## 2017-08-11 NOTE — Progress Notes (Signed)
Pt alert and oriented x4, no complaints of pain or discomfort.  Bed in low position, call bell within reach.  Bed alarms on and functioning.  Assessment done and charted.  Will continue to monitor and do hourly rounding throughout the shift 

## 2017-08-11 NOTE — Care Management (Signed)
Spoke with patient regarding discharge planning. She does have a new Dentist but does not have the card with her. She states she can get her medications at dc. Needs a PCP. Prefers [facility]. Appointment scheduled for February 12 at 3 pm. Patient updated and placed on follow up appointments. No further needs identified

## 2017-08-11 NOTE — Progress Notes (Signed)
Inpatient Diabetes Program Recommendations  AACE/ADA: New Consensus Statement on Inpatient Glycemic Control (2015)  Target Ranges:  Prepandial:   less than 140 mg/dL      Peak postprandial:   less than 180 mg/dL (1-2 hours)      Critically ill patients:  140 - 180 mg/dL   Lab Results  Component Value Date   GLUCAP 288 (H) 08/10/2017   HGBA1C 13.4 (H) 08/10/2017    Review of Glycemic Control   Results for Lindsey Hendricks, Lindsey Hendricks (MRN [id number]) as of 08/11/2017 08:37  Ref. Range 08/10/2017 12:54 08/10/2017 13:47 08/10/2017 17:16 08/10/2017 21:33 08/11/2017 08:23  Glucose-Capillary Latest Ref Range: 65 - 99 mg/dL 139 (H) 124 (H) 361 (H) 288 (H) 226 (H)    Admit with: DKA  History: DM  Home DM Meds:  Lantus 30 units BID + Novolog 1 unit for every 12 grams Carbohydrates + Novolog SSI.  Lost insurance and could no longer afford insulin.   Current Insulin Orders: Lantus 20 units qday, Novolog 0-15 units tid  1. Increase Lantus to 20 units bid  2. Start  Novolog 4 units TID with meals (hold if pt eats <50% of meal)  3. Patient will need the following Rxs at time of discharge: Lantus vial- Order # [id number] Novolog vial- Order # [id number] Insulin Syringes- Order # [id number]  Gentry Fitz, RN, BA, MHA, CDE Diabetes Coordinator Inpatient Diabetes Program  [phone] (Team Pager) [phone] ([place]) 08/11/2017 7:55 AM

## 2017-08-15 LAB — GLUCOSE, CAPILLARY: GLUCOSE-CAPILLARY: 450 mg/dL — AB (ref 65–99)

## 2017-08-23 ENCOUNTER — Ambulatory Visit (INDEPENDENT_AMBULATORY_CARE_PROVIDER_SITE_OTHER): Payer: 59 | Admitting: Physician Assistant

## 2017-08-23 ENCOUNTER — Encounter: Payer: Self-pay | Admitting: Physician Assistant

## 2017-08-23 VITALS — BP 128/84 | HR 64 | Temp 98.6°F | Resp 16 | Ht 64.0 in | Wt 151.0 lb

## 2017-08-23 DIAGNOSIS — E1169 Type 2 diabetes mellitus with other specified complication: Secondary | ICD-10-CM | POA: Diagnosis not present

## 2017-08-23 DIAGNOSIS — Z1159 Encounter for screening for other viral diseases: Secondary | ICD-10-CM | POA: Diagnosis not present

## 2017-08-23 DIAGNOSIS — E1165 Type 2 diabetes mellitus with hyperglycemia: Secondary | ICD-10-CM

## 2017-08-23 DIAGNOSIS — E785 Hyperlipidemia, unspecified: Secondary | ICD-10-CM | POA: Diagnosis not present

## 2017-08-23 DIAGNOSIS — Z114 Encounter for screening for human immunodeficiency virus [HIV]: Secondary | ICD-10-CM | POA: Diagnosis not present

## 2017-08-23 DIAGNOSIS — H8103 Meniere's disease, bilateral: Secondary | ICD-10-CM | POA: Diagnosis not present

## 2017-08-23 DIAGNOSIS — E1100 Type 2 diabetes mellitus with hyperosmolarity without nonketotic hyperglycemic-hyperosmolar coma (NKHHC): Secondary | ICD-10-CM

## 2017-08-23 MED ORDER — MECLIZINE HCL 25 MG PO TABS: 25.0000 mg | ORAL_TABLET | Freq: Three times a day (TID) | ORAL | 1 refills | Status: DC | PRN

## 2017-08-23 MED ORDER — ROSUVASTATIN CALCIUM 20 MG PO TABS: 20.0000 mg | ORAL_TABLET | Freq: Every day | ORAL | 0 refills | Status: DC

## 2017-08-23 MED ORDER — INSULIN DETEMIR 100 UNIT/ML FLEXPEN: 30.0000 [IU] | PEN_INJECTOR | Freq: Two times a day (BID) | SUBCUTANEOUS | 0 refills | Status: DC

## 2017-08-23 MED ORDER — INSULIN LISPRO 100 UNIT/ML (KWIKPEN): PEN_INJECTOR | SUBCUTANEOUS | 11 refills | Status: DC

## 2017-08-23 NOTE — Patient Instructions (Signed)

## 2017-08-23 NOTE — Progress Notes (Signed)
Patient: Lindsey Hendricks Female    DOB: Oct 01, 1955   62 y.o.   MRN: [id number] Visit Date: 08/24/2017  Today's Provider: Trinna Post, PA-C   No chief complaint on file.  Subjective:    HPI   Lindsey Hendricks is a 62 y/o woman presenting today to establish care. She has moved here from [place] to be closer to her adopted son. She is living in [place] with her husband of two years, who has serious heart issues. She works in administration.   She is not sexually active, does not use substances. Last mammogram was 5-6 years ago. She reports she had a colonoscopy here at [facility] five years ago that is normal. Reports she has records that she can bring in.  She reports she had a hysterectomy >10 years ago for irregular bleeding and she reports her cervix was removed. She did not have any cancer in her uterus.   She has insulin dependent Type II DM. She was diagnosed with DM 13 years ago. Most recently, she was hospitalized in DKA because she had run out of insulin due to changes in insurances. During admission, anion gap was closed as she started her insulin regimen.   Currently, she has been prescribed Levemir 60 units SubQ daily. She says she takes 30 units twice daily because otherwise she bottoms out. She was prescribed humal 4 units TID with meals but reports she uses more than this in a 1:12 insulin: carb ratio. She reports she had an insulin pump at one time which she discontinued due to cost. She is additionally taking Metformin 1000 mg in the morning and 1500 mg at night.   Her last A1C was 13% on 08/10/2017. She reports her last eye exam was on 03/2017 at [facility] in [place]. She does not have current foot exam. She is not on an ACEi. She does not have HTN, does not recall having microalbuminuria. She has had pneumonia, flu and Tetanus vaccinations.   She takes Crestor 20 mg daily for HLD, tolerates it well.   She also takes Meclizine for Meniere's disease. Was diagnosed 5 years ago  by Dr. Tami Ribas in ENT. Has never been on fluid pill for this.     No Known Allergies   Current Outpatient Medications:  .  Insulin Detemir (LEVEMIR FLEXTOUCH) 100 UNIT/ML Pen, Inject 30 Units into the skin 2 (two) times daily., Disp: 54 mL, Rfl: 0 .  meclizine (ANTIVERT) 25 MG tablet, Take 1 tablet (25 mg total) by mouth 3 (three) times daily as needed for dizziness., Disp: 30 tablet, Rfl: 1 .  metFORMIN (GLUCOPHAGE) 1000 MG tablet, Take 1,000-1,500 mg by mouth 2 (two) times daily. Take 1000MG  by mouth every morning and 1500MG  by mouth every evening, Disp: , Rfl:  .  rosuvastatin (CRESTOR) 20 MG tablet, Take 1 tablet (20 mg total) by mouth daily., Disp: 90 tablet, Rfl: 0 .  insulin lispro (HUMALOG) 100 UNIT/ML KiwkPen, 4 units three times daily with meal times., Disp: 15 mL, Rfl: 11  Review of Systems  Constitutional: Negative.   HENT: Positive for congestion, sinus pressure, sinus pain and sneezing.   Eyes: Negative.   Respiratory: Positive for cough. Negative for apnea, choking, chest tightness, shortness of breath, wheezing and stridor.   Cardiovascular: Negative.   Gastrointestinal: Negative.   Endocrine: Negative.   Genitourinary: Negative.   Musculoskeletal: Negative.   Skin: Negative.   Allergic/Immunologic: Positive for environmental allergies.  Neurological: Positive for tremors. Negative for dizziness,  seizures, syncope, facial asymmetry, speech difficulty, weakness, light-headedness, numbness and headaches.  Hematological: Negative.   Psychiatric/Behavioral: Negative.     Social History   Tobacco Use  . Smoking status: Never Smoker  . Smokeless tobacco: Never Used  Substance Use Topics  . Alcohol use: No    Frequency: Never   Objective:   BP 128/84 (BP Location: Right Arm, Patient Position: Sitting, Cuff Size: Normal)   Pulse 64   Temp 98.6 F (37 C) (Oral)   Resp 16   Ht 5\' 4"  (1.626 m)   Wt 151 lb (68.5 kg)   BMI 25.92 kg/m  Vitals:   08/23/17 1502    BP: 128/84  Pulse: 64  Resp: 16  Temp: 98.6 F (37 C)  TempSrc: Oral  Weight: 151 lb (68.5 kg)  Height: 5\' 4"  (1.626 m)     Physical Exam  Constitutional: She is oriented to person, place, and time. She appears well-developed and well-nourished.  HENT:  Right Ear: External ear normal.  Left Ear: External ear normal.  Mouth/Throat: Oropharynx is clear and moist. No oropharyngeal exudate.  Cardiovascular: Normal rate and regular rhythm.  Pulmonary/Chest: Effort normal and breath sounds normal.  Abdominal: Soft. Bowel sounds are normal.  Neurological: She is alert and oriented to person, place, and time.  Skin: Skin is warm and dry.  Psychiatric: She has a normal mood and affect. Her behavior is normal.    Diabetic Foot Exam - Simple   Simple Foot Form Diabetic Foot exam was performed with the following findings:  Yes 08/23/2017  2:00 PM  Visual Inspection No deformities, no ulcerations, no other skin breakdown bilaterally:  Yes Sensation Testing Intact to touch and monofilament testing bilaterally:  Yes Pulse Check Posterior Tibialis and Dorsalis pulse intact bilaterally:  Yes Comments        Assessment & Plan:     1. DM hyperosmolarity type II, uncontrolled ([place])  Recently admitted for DKA due to lack of insulin for three months. Back on insulin regimen now. Will need records from previous PCP, eye exam to update vaccinations etc. Refilled crestor. Will check urine microalbumin. Patient declines endocrinology visit right now because her A1c is very high and she wants to wait until she has a more accurate number. Will go to endocrinology in the future.  - Urine Microalbumin w/creat. ratio - Comprehensive Metabolic Panel (CMET) - Lipid Profile - insulin lispro (HUMALOG) 100 UNIT/ML KiwkPen; 4 units three times daily with meal times.  Dispense: 15 mL; Refill: 11 - rosuvastatin (CRESTOR) 20 MG tablet; Take 1 tablet (20 mg total) by mouth daily.  Dispense: 90 tablet;  Refill: 0 - Insulin Detemir (LEVEMIR FLEXTOUCH) 100 UNIT/ML Pen; Inject 30 Units into the skin 2 (two) times daily.  Dispense: 54 mL; Refill: 0  2. Hyperlipidemia associated with type 2 diabetes mellitus ([place])  Check lipid panel, continue Crestor.  3. Encounter for screening for HIV  - HIV antibody (with reflex)  4. Encounter for hepatitis C screening test for low risk patient  - Hepatitis c antibody (reflex)  5. Meniere's disease of both ears  She may revisit ENT.  - meclizine (ANTIVERT) 25 MG tablet; Take 1 tablet (25 mg total) by mouth 3 (three) times daily as needed for dizziness.  Dispense: 30 tablet; Refill: 1  Return in about 3 months (around 11/20/2017) for CPE, DM.  The entirety of the information documented in the History of Present Illness, Review of Systems and Physical Exam were personally obtained by me.  Portions of this information were initially documented by Ashley Royalty, CMA and reviewed by me for thoroughness and accuracy.          Trinna Post, PA-C  [facility]

## 2017-08-24 LAB — COMPREHENSIVE METABOLIC PANEL
ALT: 20 IU/L (ref 0–32)
AST: 22 IU/L (ref 0–40)
Albumin/Globulin Ratio: 1.6 (ref 1.2–2.2)
Albumin: 4.4 g/dL (ref 3.5–5.5)
Alkaline Phosphatase: 105 IU/L (ref 39–117)
BUN/Creatinine Ratio: 16 (ref 9–23)
BUN: 14 mg/dL (ref 6–24)
Bilirubin Total: <0.2 mg/dL (ref 0.0–1.2)
CO2: 24 mmol/L (ref 20–29)
Calcium: 9.9 mg/dL (ref 8.7–10.2)
Chloride: 101 mmol/L (ref 96–106)
Creatinine, Ser: 0.88 mg/dL (ref 0.57–1.00)
GFR calc Af Amer: 83 mL/min/{1.73_m2} (ref >59)
GFR calc non Af Amer: 72 mL/min/{1.73_m2} (ref >59)
Globulin, Total: 2.7 g/dL (ref 1.5–4.5)
Glucose: 324 mg/dL — ABNORMAL HIGH (ref 65–99)
Potassium: 4.4 mmol/L (ref 3.5–5.2)
Sodium: 141 mmol/L (ref 134–144)
Total Protein: 7.1 g/dL (ref 6.0–8.5)

## 2017-08-24 LAB — MICROALBUMIN / CREATININE URINE RATIO
Creatinine, Urine: 69.5 mg/dL
Microalb/Creat Ratio: <4.3 mg/g creat (ref 0.0–30.0)
Microalbumin, Urine: <3 ug/mL

## 2017-08-24 LAB — LIPID PANEL
Chol/HDL Ratio: 2.2 ratio (ref 0.0–4.4)
Cholesterol, Total: 208 mg/dL — ABNORMAL HIGH (ref 100–199)
HDL: 96 mg/dL (ref >39)
LDL Calculated: 88 mg/dL (ref 0–99)
Triglycerides: 121 mg/dL (ref 0–149)
VLDL Cholesterol Cal: 24 mg/dL (ref 5–40)

## 2017-08-25 ENCOUNTER — Other Ambulatory Visit: Payer: Self-pay

## 2017-08-25 DIAGNOSIS — E1165 Type 2 diabetes mellitus with hyperglycemia: Principal | ICD-10-CM

## 2017-08-25 DIAGNOSIS — E1100 Type 2 diabetes mellitus with hyperosmolarity without nonketotic hyperglycemic-hyperosmolar coma (NKHHC): Secondary | ICD-10-CM

## 2017-08-25 NOTE — Telephone Encounter (Signed)
-----   Message from Trinna Post, [place] sent at 08/25/2017  8:38 AM EST ----- Protein in urine is normal, does not need Lisinopril. Sugar is high but otherwise CMET has normalized since hospital admission. Cholesterol well controlled, continue crestor. Will see her back in office for her physical.

## 2017-08-25 NOTE — Telephone Encounter (Signed)
Tried calling; no answer.   Thanks,   -Brighton Pilley  

## 2017-08-26 NOTE — Telephone Encounter (Signed)
Tried calling; no answer.   Thanks,   -Laura  

## 2017-08-29 NOTE — Telephone Encounter (Signed)
Unable to reach patient phone continually rings. KW

## 2017-08-31 NOTE — Telephone Encounter (Signed)
Mailed letter to home address.    Thanks,   -Takhia Spoon  

## 2017-09-05 MED ORDER — INSULIN LISPRO 100 UNIT/ML (KWIKPEN): PEN_INJECTOR | SUBCUTANEOUS | 11 refills | Status: DC

## 2017-09-05 NOTE — Addendum Note (Signed)
Addended by: Ashley Royalty E on: 09/05/2017 09:34 AM   Modules accepted: Orders

## 2017-09-05 NOTE — Telephone Encounter (Signed)
Pt received letter and returned call. Pt's phone# was incorrect and it has been updated.   Pt is also requesting a refill on the following:  insulin lispro (HUMALOG) 100 UNIT/ML KiwkPen  90 day supply   [facility]  Pt stated the Rx that was recently sent in was for a 30 day supply and pt is requesting 90 day supply. Please advise. Thanks TNP

## 2017-09-05 NOTE — Telephone Encounter (Signed)
Pt advised.   Thanks,   -Nieshia Larmon  

## 2017-10-31 ENCOUNTER — Other Ambulatory Visit: Payer: Self-pay | Admitting: Physician Assistant

## 2017-10-31 DIAGNOSIS — E1165 Type 2 diabetes mellitus with hyperglycemia: Principal | ICD-10-CM

## 2017-10-31 DIAGNOSIS — E1100 Type 2 diabetes mellitus with hyperosmolarity without nonketotic hyperglycemic-hyperosmolar coma (NKHHC): Secondary | ICD-10-CM

## 2017-10-31 MED ORDER — INSULIN LISPRO 100 UNIT/ML (KWIKPEN): PEN_INJECTOR | SUBCUTANEOUS | 11 refills | Status: DC

## 2017-10-31 NOTE — Telephone Encounter (Signed)
Pt needs refill on her insulin.  She is completely out...  Humalog 100unit   [facility] [address]  Pt's call back [phone]  Thanks Con Memos

## 2017-10-31 NOTE — Telephone Encounter (Signed)
Pt called back stating to insurance was not going to pay for her Humalog because she is technically too early to refill with the given instructions. She is taking 21-25 units daily. She needs this increased so that insurance will cover it.

## 2017-10-31 NOTE — Addendum Note (Signed)
Addended by: Trinna Post on: 10/31/2017 05:14 PM   Modules accepted: Orders

## 2017-10-31 NOTE — Telephone Encounter (Signed)
Please advise 

## 2017-11-02 ENCOUNTER — Encounter: Payer: Self-pay | Admitting: Physician Assistant

## 2017-11-02 ENCOUNTER — Encounter: Payer: 59 | Admitting: Physician Assistant

## 2017-11-02 ENCOUNTER — Ambulatory Visit (INDEPENDENT_AMBULATORY_CARE_PROVIDER_SITE_OTHER): Payer: 59 | Admitting: Physician Assistant

## 2017-11-02 VITALS — BP 122/72 | HR 85 | Temp 98.5°F | Ht 64.0 in | Wt 159.2 lb

## 2017-11-02 DIAGNOSIS — E1100 Type 2 diabetes mellitus with hyperosmolarity without nonketotic hyperglycemic-hyperosmolar coma (NKHHC): Secondary | ICD-10-CM | POA: Diagnosis not present

## 2017-11-02 DIAGNOSIS — Z0001 Encounter for general adult medical examination with abnormal findings: Secondary | ICD-10-CM

## 2017-11-02 DIAGNOSIS — Z114 Encounter for screening for human immunodeficiency virus [HIV]: Secondary | ICD-10-CM

## 2017-11-02 DIAGNOSIS — E1149 Type 2 diabetes mellitus with other diabetic neurological complication: Secondary | ICD-10-CM | POA: Diagnosis not present

## 2017-11-02 DIAGNOSIS — M65331 Trigger finger, right middle finger: Secondary | ICD-10-CM

## 2017-11-02 DIAGNOSIS — Z794 Long term (current) use of insulin: Secondary | ICD-10-CM

## 2017-11-02 DIAGNOSIS — E1165 Type 2 diabetes mellitus with hyperglycemia: Secondary | ICD-10-CM

## 2017-11-02 DIAGNOSIS — Z1239 Encounter for other screening for malignant neoplasm of breast: Secondary | ICD-10-CM

## 2017-11-02 DIAGNOSIS — Z1159 Encounter for screening for other viral diseases: Secondary | ICD-10-CM

## 2017-11-02 DIAGNOSIS — Z23 Encounter for immunization: Secondary | ICD-10-CM

## 2017-11-02 DIAGNOSIS — Z0000 Encounter for general adult medical examination without abnormal findings: Secondary | ICD-10-CM

## 2017-11-02 MED ORDER — ROSUVASTATIN CALCIUM 20 MG PO TABS: 20.0000 mg | ORAL_TABLET | Freq: Every day | ORAL | 0 refills | Status: DC

## 2017-11-02 MED ORDER — METFORMIN HCL 1000 MG PO TABS: ORAL_TABLET | ORAL | 0 refills | Status: DC

## 2017-11-02 MED ORDER — GABAPENTIN 300 MG PO CAPS: 300.0000 mg | ORAL_CAPSULE | Freq: Every day | ORAL | 1 refills | Status: DC

## 2017-11-02 NOTE — Progress Notes (Signed)
Patient: Lindsey Hendricks, Female    DOB: 1956-04-17, 62 y.o.   MRN: [id number] Visit Date: 11/02/2017  Today's Provider: Trinna Post, PA-C   Chief Complaint  Patient presents with  . Annual Exam   Subjective:    Annual physical exam Lindsey Hendricks is a 62 y.o. female who presents today for health maintenance and complete physical. She feels well. She reports exercising none regularly but she climbs stairs daily and walks at work. She reports she is sleeping fairly well.  Colonoscopy was seven years ago and normal. She will send colonoscopy results via [place]. PAP: reports she has had a hysterectomy and cervix was removed. Mammogram: she is due  Has been having a hard time recently due to her husband being chronically ill. She reports a cardiologist told him that he only had 5 years to live. She reports that she hasn't been keeping up well with her insulin due to this. She also has experienced the death of two family members.   She also reports continued issues with her right middle finger. There is a hard lump on her palm and sometimes the finger gets stuck after she makes a fist.  -----------------------------------------------------------------   Review of Systems  Constitutional: Negative.   HENT: Negative.   Eyes: Negative.   Respiratory: Negative.   Cardiovascular: Negative.   Gastrointestinal: Negative.   Endocrine: Negative.   Genitourinary: Negative.   Musculoskeletal: Positive for arthralgias.  Skin: Negative.   Allergic/Immunologic: Negative.   Neurological: Negative.   Hematological: Negative.   Psychiatric/Behavioral: Negative.     Social History      She  reports that she has never smoked. She has never used smokeless tobacco. She reports that she does not drink alcohol or use drugs.       Social History   Socioeconomic History  . Marital status: Married    Spouse name: Not on file  . Number of children: Not on file  . Years of education:  Not on file  . Highest education level: Not on file  Occupational History  . Not on file  Social Needs  . Financial resource strain: Not on file  . Food insecurity:    Worry: Not on file    Inability: Not on file  . Transportation needs:    Medical: Not on file    Non-medical: Not on file  Tobacco Use  . Smoking status: Never Smoker  . Smokeless tobacco: Never Used  Substance and Sexual Activity  . Alcohol use: No    Frequency: Never  . Drug use: No  . Sexual activity: Not on file  Lifestyle  . Physical activity:    Days per week: Not on file    Minutes per session: Not on file  . Stress: Not on file  Relationships  . Social connections:    Talks on phone: Not on file    Gets together: Not on file    Attends religious service: Not on file    Active member of club or organization: Not on file    Attends meetings of clubs or organizations: Not on file    Relationship status: Not on file  Other Topics Concern  . Not on file  Social History Narrative  . Not on file    Past Medical History:  Diagnosis Date  . Diabetes mellitus without complication [facility])      Patient Active Problem List   Diagnosis Date Noted  . DKA (diabetic ketoacidoses) ([place])  08/10/2017    Past Surgical History:  Procedure Laterality Date  . ABDOMINAL HYSTERECTOMY    . ANKLE FRACTURE SURGERY Right   . BREAST SURGERY Right   . CHOLECYSTECTOMY    . TONSILLECTOMY    . WRIST ARTHROSCOPY Left    Cyst     Family History        Family Status  Relation Name Status  . Father  Deceased  . Mother  Alive  . Sister  Alive  . MGM  Deceased  . MGF  Deceased        Her family history includes COPD in her sister; Congestive Heart Failure in her maternal grandfather and maternal grandmother; Healthy in her mother; Irregular heart beat in her mother; Lung cancer in her father.      No Known Allergies   Current Outpatient Medications:  .  Insulin Detemir (LEVEMIR FLEXTOUCH) 100 UNIT/ML Pen,  Inject 30 Units into the skin 2 (two) times daily., Disp: 54 mL, Rfl: 0 .  insulin lispro (HUMALOG) 100 UNIT/ML KiwkPen, 7-8 units three times daily with meal times., Disp: 15 mL, Rfl: 11 .  meclizine (ANTIVERT) 25 MG tablet, Take 1 tablet (25 mg total) by mouth 3 (three) times daily as needed for dizziness., Disp: 30 tablet, Rfl: 1 .  metFORMIN (GLUCOPHAGE) 1000 MG tablet, Take 1,000-1,500 mg by mouth 2 (two) times daily. Take 1000MG  by mouth every morning and 1500MG  by mouth every evening, Disp: , Rfl:  .  rosuvastatin (CRESTOR) 20 MG tablet, Take 1 tablet (20 mg total) by mouth daily., Disp: 90 tablet, Rfl: 0   Patient Care Team: Paulene Floor as PCP - General (Physician Assistant)      Objective:   Vitals: BP 122/72 (BP Location: Right Arm, Patient Position: Sitting, Cuff Size: Normal)   Pulse 85   Temp 98.5 F (36.9 C) (Oral)   Ht 5\' 4"  (1.626 m)   Wt 159 lb 3.2 oz (72.2 kg)   SpO2 97%   BMI 27.33 kg/m    Physical Exam  Constitutional: She is oriented to person, place, and time. She appears well-developed and well-nourished.  HENT:  Head: Normocephalic and atraumatic.  Right Ear: External ear normal.  Left Ear: External ear normal.  Mouth/Throat: Oropharynx is clear and moist.  Eyes: Conjunctivae are normal. Right eye exhibits no discharge. Left eye exhibits no discharge.  Neck: Neck supple.  Cardiovascular: Normal rate and regular rhythm.  Pulmonary/Chest: Effort normal and breath sounds normal.  Breast Exam declined  Abdominal: Soft. Bowel sounds are normal.  Musculoskeletal:  Pain and tenderness to palpation over distal palm, just inferior to third digit. There is a palpable, tender nodule.   Lymphadenopathy:    She has no cervical adenopathy.  Neurological: She is alert and oriented to person, place, and time.  Skin: Skin is warm and dry.  Psychiatric: She has a normal mood and affect. Her behavior is normal.     Depression Screen PHQ 2/9 Scores  11/02/2017 08/25/2017  PHQ - 2 Score 0 0  PHQ- 9 Score 0 0      Assessment & Plan:     Routine Health Maintenance and Physical Exam  Exercise Activities and Dietary recommendations Goals    None       There is no immunization history on file for this patient.  Health Maintenance  Topic Date Due  . Hepatitis C Screening  Mar 04, 1956  . PNEUMOCOCCAL POLYSACCHARIDE VACCINE (1) 01/03/1960  . OPHTHALMOLOGY EXAM  01/03/1968  .  HIV Screening  01/02/1973  . TETANUS/TDAP  01/02/1977  . MAMMOGRAM  01/03/2008  . COLONOSCOPY  01/03/2008  . INFLUENZA VACCINE  04/26/2018 (Originally 02/09/2018)  . HEMOGLOBIN A1C  02/07/2018  . FOOT EXAM  08/23/2018  . URINE MICROALBUMIN  08/23/2018     Discussed health benefits of physical activity, and encouraged her to engage in regular exercise appropriate for her age and condition.    1. Annual physical exam   2. Trigger middle finger of right hand  - Ambulatory referral to Orthopedics  3. Type 2 diabetes mellitus with hyperosmolarity without coma, with long-term current use of insulin (HCC)  - HgB A1c - Ambulatory referral to Ophthalmology - metFORMIN (GLUCOPHAGE) 1000 MG tablet; Take 1000MG  by mouth every morning and 1500MG  by mouth every evening  Dispense: 270 tablet; Refill: 0  4. Breast cancer screening  - MM Digital Screening; Future  5. Encounter for screening for HIV  - HIV antibody  6. Encounter for hepatitis C screening test for low risk patient  - Hepatitis C Antibody  7. Other diabetic neurological complication associated with type 2 diabetes mellitus (HCC)  - gabapentin (NEURONTIN) 300 MG capsule; Take 1 capsule (300 mg total) by mouth at bedtime.  Dispense: 90 capsule; Refill: 1  8. DM hyperosmolarity type II, uncontrolled (HCC)  - rosuvastatin (CRESTOR) 20 MG tablet; Take 1 tablet (20 mg total) by mouth daily.  Dispense: 90 tablet; Refill: 0  Return in about 6 months (around 05/04/2018) for DM, HLD .  The  entirety of the information documented in the History of Present Illness, Review of Systems and Physical Exam were personally obtained by me. Portions of this information were initially documented by Tiburcio Pea, CMA and reviewed by me for thoroughness and accuracy.      --------------------------------------------------------------------    Trinna Post, PA-C  [facility]

## 2017-11-02 NOTE — Patient Instructions (Signed)

## 2017-11-03 LAB — HEPATITIS C ANTIBODY: Hep C Virus Ab: <0.1 s/co ratio (ref 0.0–0.9)

## 2017-11-03 LAB — HEMOGLOBIN A1C
Est. average glucose Bld gHb Est-mCnc: 312 mg/dL
Hgb A1c MFr Bld: 12.5 % — ABNORMAL HIGH (ref 4.8–5.6)

## 2017-11-03 LAB — HIV ANTIBODY (ROUTINE TESTING W REFLEX): HIV Screen 4th Generation wRfx: NONREACTIVE

## 2017-11-08 ENCOUNTER — Encounter: Payer: Self-pay | Admitting: Physician Assistant

## 2017-11-15 ENCOUNTER — Other Ambulatory Visit: Payer: Self-pay | Admitting: Physician Assistant

## 2017-11-15 DIAGNOSIS — E1100 Type 2 diabetes mellitus with hyperosmolarity without nonketotic hyperglycemic-hyperosmolar coma (NKHHC): Secondary | ICD-10-CM

## 2017-11-15 DIAGNOSIS — Z794 Long term (current) use of insulin: Principal | ICD-10-CM

## 2018-01-20 ENCOUNTER — Other Ambulatory Visit: Payer: Self-pay | Admitting: Physician Assistant

## 2018-01-20 DIAGNOSIS — E1100 Type 2 diabetes mellitus with hyperosmolarity without nonketotic hyperglycemic-hyperosmolar coma (NKHHC): Secondary | ICD-10-CM

## 2018-01-20 DIAGNOSIS — E1165 Type 2 diabetes mellitus with hyperglycemia: Principal | ICD-10-CM

## 2018-01-23 ENCOUNTER — Other Ambulatory Visit: Payer: Self-pay | Admitting: Physician Assistant

## 2018-01-23 DIAGNOSIS — E1100 Type 2 diabetes mellitus with hyperosmolarity without nonketotic hyperglycemic-hyperosmolar coma (NKHHC): Secondary | ICD-10-CM

## 2018-01-23 DIAGNOSIS — Z794 Long term (current) use of insulin: Principal | ICD-10-CM

## 2018-02-15 LAB — HEMOGLOBIN A1C: HEMOGLOBIN A1C: 12.1 — AB (ref 4.0–6.0)

## 2018-02-21 ENCOUNTER — Other Ambulatory Visit: Payer: Self-pay

## 2018-02-21 DIAGNOSIS — E1165 Type 2 diabetes mellitus with hyperglycemia: Principal | ICD-10-CM

## 2018-02-21 DIAGNOSIS — E1100 Type 2 diabetes mellitus with hyperosmolarity without nonketotic hyperglycemic-hyperosmolar coma (NKHHC): Secondary | ICD-10-CM

## 2018-02-21 MED ORDER — ROSUVASTATIN CALCIUM 20 MG PO TABS: 20.0000 mg | ORAL_TABLET | Freq: Every day | ORAL | 0 refills | Status: DC

## 2018-02-21 NOTE — Telephone Encounter (Signed)
Will forward to PCP  Caelum Federici, Dionne Bucy, MD, MPH [facility] 02/21/2018 9:41 AM

## 2018-03-06 ENCOUNTER — Encounter: Payer: Self-pay | Admitting: Dietician

## 2018-03-06 ENCOUNTER — Encounter: Payer: 59 | Attending: "Endocrinology | Admitting: Dietician

## 2018-03-06 VITALS — Ht 64.0 in | Wt 168.9 lb

## 2018-03-06 DIAGNOSIS — Z713 Dietary counseling and surveillance: Secondary | ICD-10-CM | POA: Insufficient documentation

## 2018-03-06 DIAGNOSIS — E109 Type 1 diabetes mellitus without complications: Secondary | ICD-10-CM | POA: Diagnosis not present

## 2018-03-06 DIAGNOSIS — E1040 Type 1 diabetes mellitus with diabetic neuropathy, unspecified: Secondary | ICD-10-CM

## 2018-03-07 ENCOUNTER — Encounter: Payer: Self-pay | Admitting: Dietician

## 2018-03-07 NOTE — Progress Notes (Signed)
Called pt today-pt reports BG 274 at bedtime last night and did NOT take a correction and awoke with BG LO at 5a. She took only tresiba 25 units this am. BG 2 hr pp breakfast was 313 and took a correction then BG's 214, 194, 199 afterward.

## 2018-03-07 NOTE — Patient Instructions (Addendum)
  Scan Libre for  blood sugars at least 4 x day before each meal and before bed every day and 2 hr pp  Bring blood sugar records to the next appointment/class Exercise: continue walking for 30-45 min daily Eat 3 meals day and 1-2  snacks a day Space meals 4-6 hours apart Eat 30-45 grams carbs for meals + protein Eat 15 gram carbs for snack + protein Avoid sugar sweetened drinks (soda, tea, coffee, sports drinks, juices) Limit intake of fried foods Complete 3 Day Food Record and bring to next appt-count carbs accurately Drink plenty of water Make dentist / eye doctor appointments Get a Sharps container Carry fast acting glucose and a snack at all times Rotate injection sites-avoid using sites that have knots or lumps  Once [place] is adjusted, test ICR and ISF Go to websites and review Tandem and Medtronic pumps Return for appointment/classes on:  TBD

## 2018-03-07 NOTE — Progress Notes (Signed)
Diabetes Self-Management Education  Visit Type: First/Initial  Appt. Start Time: 1610 Appt. End Time: 0086  03/07/2018  Lindsey Hendricks, identified by name and date of birth, is a 62 y.o. female with a diagnosis of Diabetes: Type 1.   ASSESSMENT  Height 5\' 4"  (1.626 m), weight 168 lb 14.4 oz (76.6 kg). Body mass index is 28.99 kg/m.  Diabetes Self-Management Education - 03/07/18 1143      Visit Information   Visit Type  First/Initial      Initial Visit   Diabetes Type  Type 1      Health Coping   How would you rate your overall health?  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  12      Pre-Education Assessment   Patient understands the diabetes disease and treatment process.  Demonstrates understanding / competency    Patient understands incorporating nutritional management into lifestyle.  Demonstrates understanding / competency    Patient undertands incorporating physical activity into lifestyle.  Demonstrates understanding / competency    Patient understands using medications safely.  Needs Review   noted some lumps & bruising on abdomen at injection sites   Patient understands monitoring blood glucose, interpreting and using results  Demonstrates understanding / competency   uses freestyle libre/scans 6+x/day   Patient understands prevention, detection, and treatment of acute complications.  Needs Review    Patient understands  prevention, detection, and treatment of chronic complications.  Demonstrates understanding / competency    Patient understands how to develop strategies to address psychosocial issues.  Demonstrates understanding / competency   increased stress due to taking care of sick husband and also in process of moving to new home    Patient understands how to develop strategies to promote health/change behavior.  Demonstrates understanding / competency      Complications   Last HgB A1C per patient/outside source  12.1 %   02-15-18   How often do you check your blood sugar?  > 4 times/day    Fasting Blood glucose range (mg/dL)  <70;130-179;180-200;>200;70-129    Postprandial Blood glucose range (mg/dL)  >200;130-179;180-200    Have you had a dilated eye exam in the past 12 months?  Yes   1 year ago   Have you had a dental exam in the past 12 months?  Yes   1 year ago   Are you checking your feet?  Yes    How many days per week are you checking your feet?  7      Dietary Intake   Breakfast  eats breakfast at 6am    Snack (morning)  eats occasional snack of fruit or trail mix at 9a if needed to prevent low BG    Lunch  eats lunch at 12:30p-eats fried foods 2-3x/wk     Snack (afternoon)  none    Dinner  eats supper at [employer] (  evening)  eats fruit or trailmix at 9p to prevent low BG during night    Beverage(s)  drinks water 4-5x/day; drinks fruit juice occasionally to treat  low BG's       Exercise   Exercise Type  Light (walking / raking leaves)    How many days per week to you exercise?  7    How many minutes per day do you exercise?  42.5    Total minutes per week of exercise  297.5      Patient Education   Previous Diabetes Education  Yes (please comment)    Nutrition management   Carbohydrate counting;Meal timing in regards to the patients' current diabetes medication.   reviewed carb counting-pt comfortable with carb counting and accurate most of time   Physical activity and exercise    Role of exercise on diabetes management, blood pressure control and cardiac health.;Helped patient identify appropriate exercises in relation to his/her diabetes, diabetes complications and other health issue.    Medications  Taught/reviewed insulin injection, site rotation, insulin storage and needle disposal.;Reviewed patients medication for diabetes, action, purpose, timing of dose and side effects.   reviewed use of Tresiba and Humalog and sliding scale    Monitoring  Yearly dilated eye exam;Identified appropriate SMBG and/or A1C goals.;Taught/discussed recording of test results and interpretation of SMBG.   pt uses Freestyle Libre   Acute complications  Taught treatment of hypoglycemia - the 15 rule.;Discussed and identified patients' treatment of hyperglycemia.    Chronic complications  Relationship between chronic complications and blood glucose control;Retinopathy and reason for yearly dilated eye exams;Dental care    Psychosocial adjustment  Role of stress on diabetes    Personal strategies to promote health  Helped patient develop diabetes management plan for (enter comment)      Outcomes   Expected Outcomes  Demonstrated interest in learning. Expect positive outcomes       Individualized Plan for Diabetes Self-Management Training:   Learning Objective:  Patient will have a greater understanding of diabetes self-management. Patient education plan is to attend individual and/or group sessions per assessed needs and concerns.   Plan:  Scan Libre for  blood sugars at least 4 x day before each meal and before bed every day and 2 hr pp  Bring blood sugar records to the next appointment/class Exercise: continue walking for 30-45 min daily Eat 3 meals day and 1-2  snacks a day Space meals 4-6 hours apart Eat 30-45 grams carbs for meals + protein Eat 15 gram carbs for snack + protein Avoid sugar sweetened drinks (soda, tea, coffee, sports drinks, juices) Limit intake of fried  foods Complete 3 Day Food Record and bring to next appt-count carbs accurately Drink plenty of water Make dentist / eye doctor appointments Get a Sharps container Carry fast acting glucose and a snack at all times Rotate injection sites-avoid using sites that have knots or lumps  Once [place] is adjusted, test ICR and ISF Go to websites and review Tandem and Medtronic pumps Return for appointment/classes on:  TBD   Expected Outcomes:  Demonstrated interest in learning. Expect positive outcomes  Education material provided: medical alert ID card and coupon for free medical alert necklace;  1 sample bag of insulin syringes with 6 mm needles  If problems or questions, patient to contact team via:  [phone]  Future DSME appointment:  TBD

## 2018-03-27 ENCOUNTER — Encounter: Payer: Self-pay | Admitting: Dietician

## 2018-03-27 NOTE — Progress Notes (Signed)
Called pt for update-pt reports MD office called after her last appt with me and advised her to decrease Tresiba to 46 units but she had already dropped dose to 25 units because of frequent low BG's especially at night.  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  She reports she is still having low BG's but has been much more physically active due to recent move.  Discussed that she may still need less [place] and consider eating a bedtime snack with no Humalog coverage. Pt does not want to schedule any FU at this time but will call and schedule an appt after her life slows down.

## 2018-04-10 ENCOUNTER — Encounter: Payer: Self-pay | Admitting: Dietician

## 2018-04-10 NOTE — Progress Notes (Signed)
called pt but no answer-left message for pt to call me with update on BG's

## 2018-04-16 ENCOUNTER — Other Ambulatory Visit: Payer: Self-pay | Admitting: Physician Assistant

## 2018-04-16 DIAGNOSIS — E1165 Type 2 diabetes mellitus with hyperglycemia: Principal | ICD-10-CM

## 2018-04-16 DIAGNOSIS — E1100 Type 2 diabetes mellitus with hyperosmolarity without nonketotic hyperglycemic-hyperosmolar coma (NKHHC): Secondary | ICD-10-CM

## 2018-04-23 ENCOUNTER — Other Ambulatory Visit: Payer: Self-pay | Admitting: Physician Assistant

## 2018-04-23 DIAGNOSIS — E1149 Type 2 diabetes mellitus with other diabetic neurological complication: Secondary | ICD-10-CM

## 2018-04-24 ENCOUNTER — Encounter: Payer: Self-pay | Admitting: Dietician

## 2018-04-24 NOTE — Progress Notes (Signed)
Have not heard from pt-called pt-pt upset and crying due to husband's death on 10-May-2018 and is trying to make funeral arrangements-states "I'm all alone and have no one now"-has no children or family here-reports recent BG's have been up during day and often goes low at night-advised pt to call me if needed

## 2018-05-04 ENCOUNTER — Ambulatory Visit: Payer: 59 | Admitting: Physician Assistant

## 2018-05-08 ENCOUNTER — Encounter: Payer: Self-pay | Admitting: Dietician

## 2018-05-08 NOTE — Progress Notes (Signed)
Called pt-she has returned to work today after having husband's funeral out of state-pt reports BG's are up and down-she has an appt with Dr. Honor Junes on 05-11-18; advised her to call me if she decides to schedule another appt with me-also offered to schedule an appt with a counselor at [facility] [place] if she desires

## 2018-05-18 ENCOUNTER — Encounter: Payer: Self-pay | Admitting: Physician Assistant

## 2018-05-18 ENCOUNTER — Ambulatory Visit (INDEPENDENT_AMBULATORY_CARE_PROVIDER_SITE_OTHER): Payer: 59 | Admitting: Physician Assistant

## 2018-05-18 VITALS — BP 140/88 | HR 78 | Temp 98.2°F | Wt 166.4 lb

## 2018-05-18 DIAGNOSIS — F4320 Adjustment disorder, unspecified: Secondary | ICD-10-CM | POA: Diagnosis not present

## 2018-05-18 DIAGNOSIS — R220 Localized swelling, mass and lump, head: Secondary | ICD-10-CM | POA: Diagnosis not present

## 2018-05-18 MED ORDER — SERTRALINE HCL 50 MG PO TABS: 50.0000 mg | ORAL_TABLET | Freq: Every day | ORAL | 0 refills | Status: DC

## 2018-05-18 MED ORDER — MUPIROCIN 2 % EX OINT: 1.0000 "application " | TOPICAL_OINTMENT | Freq: Two times a day (BID) | CUTANEOUS | 0 refills | Status: DC

## 2018-05-18 NOTE — Progress Notes (Signed)
Patient: Lindsey Hendricks Female    DOB: October 02, 1955   62 y.o.   MRN: [id number] Visit Date: 05/18/2018  Today's Provider: Trinna Post, PA-C   Chief Complaint  Patient presents with  . Nose Problem   Subjective:    HPI Nose Problem Patient presents today with nostril problem since 2 days ago. Patient states she is having swollen and pain in her left nostril.  Her husband has recently died after a long illness. She says she has been crying constantly and wiping her nose frequently. She denies fevers, discharge, bleeding.  She lives alone, does not have children. Does not work and does not have support system. Says she spent the last two months with her husband in the ICU and has neglected caring for herself. She reports feeling extremely sad constantly, having difficulty functioning.      No Known Allergies   Current Outpatient Medications:  .  Insulin Degludec 200 UNIT/ML SOPN, Inject 50 Units into the skin daily., Disp: , Rfl:  .  insulin lispro (HUMALOG) 100 UNIT/ML KiwkPen, 7-8 units three times daily with meal times., Disp: 15 mL, Rfl: 11 .  LEVEMIR FLEXTOUCH 100 UNIT/ML Pen, INJECT 30 UNITS INTO THE SKIN 2 (TWO) TIMES DAILY., Disp: 60 mL, Rfl: 0 .  meclizine (ANTIVERT) 25 MG tablet, Take 1 tablet (25 mg total) by mouth 3 (three) times daily as needed for dizziness., Disp: 30 tablet, Rfl: 1 .  metFORMIN (GLUCOPHAGE) 1000 MG tablet, TAKE 1 TAB BY MOUTH EVERY MORNING AND 1.5 TABS BY MOUTH EVERY EVENING, Disp: 225 tablet, Rfl: 1 .  rosuvastatin (CRESTOR) 20 MG tablet, Take 1 tablet (20 mg total) by mouth daily., Disp: 90 tablet, Rfl: 0 .  gabapentin (NEURONTIN) 300 MG capsule, TAKE 1 CAPSULE BY MOUTH EVERYDAY AT BEDTIME (Patient not taking: Reported on 05/18/2018), Disp: 90 capsule, Rfl: 0  Review of Systems  Constitutional: Negative.   HENT: Negative.   Respiratory: Negative.   Cardiovascular: Negative.   Gastrointestinal: Negative.   Allergic/Immunologic:  Negative.   Neurological: Negative.     Social History   Tobacco Use  . Smoking status: Never Smoker  . Smokeless tobacco: Never Used  Substance Use Topics  . Alcohol use: No    Frequency: Never   Objective:   BP 140/88 (BP Location: Right Arm, Patient Position: Sitting, Cuff Size: Normal)   Pulse 78   Temp 98.2 F (36.8 C) (Oral)   Wt 166 lb 6.4 oz (75.5 kg)   SpO2 97%   BMI 28.56 kg/m  Vitals:   05/18/18 0903  BP: 140/88  Pulse: 78  Temp: 98.2 F (36.8 C)  TempSrc: Oral  SpO2: 97%  Weight: 166 lb 6.4 oz (75.5 kg)     Physical Exam  Constitutional: She is oriented to person, place, and time. She appears well-developed and well-nourished.  HENT:  Head:    Right Ear: External ear normal.  Left Ear: External ear normal.  Cardiovascular: Normal rate and regular rhythm.  Pulmonary/Chest: Effort normal and breath sounds normal.  Neurological: She is alert and oriented to person, place, and time.  Skin: Skin is warm and dry.  Psychiatric: She exhibits a depressed mood.        Assessment & Plan:     1. Nasal swelling  - mupirocin ointment (BACTROBAN) 2 %; Place 1 application into the nose 2 (two) times daily.  Dispense: 22 g; Refill: 0  2. Adjustment disorder, unspecified type  Patient having  difficulty coping with husband's death. Did inform of hospice grief counseling services. She is interested in trying medication. Counseled no medication will eliminate the need for grieving but she may be able to function better.  - sertraline (ZOLOFT) 50 MG tablet; Take 1 tablet (50 mg total) by mouth daily.  Dispense: 90 tablet; Refill: 0  Return in about 1 month (around 06/17/2018) for grief .  The entirety of the information documented in the History of Present Illness, Review of Systems and Physical Exam were personally obtained by me. Portions of this information were initially documented by Hurman Horn, CMA and reviewed by me for thoroughness and accuracy.            Trinna Post, PA-C  [facility]

## 2018-05-18 NOTE — Patient Instructions (Signed)
[facility]  Address: [address], [place], [place] [address]  Phone: [phone]   Complicated Grieving Grief is a normal response to the death of someone close to you. Feelings of fear, anger, and guilt can affect almost everyone who loses a loved one. It is also common to have symptoms of depression while you are grieving. These include problems with sleep, loss of appetite, and lack of energy. They may last for weeks or months after a loss. Complicated grief is different from normal grief or depression. Normal grieving involves sadness and feelings of loss, but these feelings are not constant. Complicated grief is a constant and severe type of grief. It interferes with your ability to function normally. It may last for several months to a year or longer. Complicated grief may require treatment from a mental health care provider. What are the causes? It is not known why some people continue to struggle with grief and others do not. You may be at higher risk for complicated grief if:  The death of your loved one was sudden or unexpected.  The death of your loved one was due to a violent event.  Your loved one committed suicide.  Your loved one was a child or a young person.  You were very close to or dependent on the loved one.  You have a history of depression.  What are the signs or symptoms? Signs and symptoms of complicated grief may include:  Feeling disbelief or numbness.  Being unable to enjoy good memories of your loved one.  Needing to avoid anything that reminds you of your loved one.  Being unable to stop thinking about the death.  Feeling intense anger or guilt.  Feeling alone and hopeless.  Feeling that your life is meaningless and empty.  Losing the desire to live.  How is this diagnosed? Your health care provider may diagnose complicated grief if:  You have constant symptoms of grief for 6-12 months  or longer.  Your symptoms are interfering with your ability to live your life.  Your health care provider may want you to see a mental health care provider. Many symptoms of depression are similar to the symptoms of complicated grief. It is important to be evaluated for complicated grief along with other mental health conditions. How is this treated? Talk therapy with a mental health provider is the most common treatment for complicated grief. During therapy, you will learn healthy ways to cope with the loss of your loved one. In some cases, your mental health care provider may also recommend antidepressant medicines. Follow these instructions at home:  Take care of yourself. ? Eat regular meals and maintain a healthy diet. Eat plenty of fruits, vegetables, and whole grains. ? Try to get some exercise each day. ? Keep regular hours for sleep. Try to get at least 8 hours of sleep each night.  Do not use drugs or alcohol to ease your symptoms.  Take medicines only as directed by your health care provider.  Spend time with friends and loved ones.  Consider joining a grief (bereavement) support group to help you deal with your loss.  Keep all follow-up visits as directed by your health care provider. This is important. Contact a health care provider if:  Your symptoms keep you from functioning normally.  Your symptoms do not get better with treatment. Get help right away if:  You have serious thoughts of hurting yourself or someone else.  You have suicidal feelings. This information is not intended to replace advice given to you by your health care provider. Make sure you discuss any questions you have with your health care provider. Document Released: 06/28/2005 Document Revised: 12/04/2015 Document Reviewed: 12/06/2013 Elsevier Interactive Patient Education  [employer].

## 2018-05-21 ENCOUNTER — Other Ambulatory Visit: Payer: Self-pay | Admitting: Physician Assistant

## 2018-05-21 DIAGNOSIS — E1100 Type 2 diabetes mellitus with hyperosmolarity without nonketotic hyperglycemic-hyperosmolar coma (NKHHC): Secondary | ICD-10-CM

## 2018-05-21 DIAGNOSIS — E1165 Type 2 diabetes mellitus with hyperglycemia: Principal | ICD-10-CM

## 2018-05-22 ENCOUNTER — Encounter: Payer: Self-pay | Admitting: Dietician

## 2018-05-22 NOTE — Progress Notes (Signed)
Received call from pt on 05-16-18 requesting appt with counselor and sent email to [facility] requesting her to schedule an appt with pt

## 2018-06-19 ENCOUNTER — Ambulatory Visit (INDEPENDENT_AMBULATORY_CARE_PROVIDER_SITE_OTHER): Payer: 59 | Admitting: Physician Assistant

## 2018-06-19 ENCOUNTER — Encounter: Payer: Self-pay | Admitting: Dietician

## 2018-06-19 ENCOUNTER — Encounter: Payer: Self-pay | Admitting: Physician Assistant

## 2018-06-19 VITALS — BP 123/88 | HR 96 | Temp 98.4°F | Resp 16 | Wt 169.0 lb

## 2018-06-19 DIAGNOSIS — E1165 Type 2 diabetes mellitus with hyperglycemia: Secondary | ICD-10-CM

## 2018-06-19 DIAGNOSIS — Z794 Long term (current) use of insulin: Secondary | ICD-10-CM | POA: Diagnosis not present

## 2018-06-19 DIAGNOSIS — F4321 Adjustment disorder with depressed mood: Secondary | ICD-10-CM | POA: Diagnosis not present

## 2018-06-19 NOTE — Progress Notes (Signed)
Patient: Lindsey Hendricks Female    DOB: [birth year]   62 y.o.   MRN: [id number] Visit Date: 06/20/2018  Today's Provider: Trinna Post, PA-C   Chief Complaint  Patient presents with  . Follow-up    Grief   Subjective:    HPI  Grief, Follow up:  The patient was last seen for Grief 1 months ago. Changes made since that visit include start Sertraline 50 mg.  She reports excellent compliance with treatment. She is not having side effects. Patient reports that she feels like her symptoms are stable for the most part.. [facility]  Still working through the loss of her husband two months ago. Does not want to increase the medication.   ------------------------------------------------------------------------      No Known Allergies   Current Outpatient Medications:  .  Insulin Degludec 200 UNIT/ML SOPN, Inject 50 Units into the skin daily., Disp: , Rfl:  .  insulin lispro (HUMALOG) 100 UNIT/ML KiwkPen, 7-8 units three times daily with meal times., Disp: 15 mL, Rfl: 11 .  LEVEMIR FLEXTOUCH 100 UNIT/ML Pen, INJECT 30 UNITS INTO THE SKIN 2 (TWO) TIMES DAILY., Disp: 60 mL, Rfl: 0 .  meclizine (ANTIVERT) 25 MG tablet, Take 1 tablet (25 mg total) by mouth 3 (three) times daily as needed for dizziness., Disp: 30 tablet, Rfl: 1 .  metFORMIN (GLUCOPHAGE) 1000 MG tablet, TAKE 1 TAB BY MOUTH EVERY MORNING AND 1.5 TABS BY MOUTH EVERY EVENING, Disp: 225 tablet, Rfl: 1 .  rosuvastatin (CRESTOR) 20 MG tablet, TAKE 1 TABLET BY MOUTH EVERY DAY, Disp: 90 tablet, Rfl: 1 .  sertraline (ZOLOFT) 50 MG tablet, Take 1 tablet (50 mg total) by mouth daily., Disp: 90 tablet, Rfl: 0 .  gabapentin (NEURONTIN) 300 MG capsule, TAKE 1 CAPSULE BY MOUTH EVERYDAY AT BEDTIME (Patient not taking: Reported on 05/18/2018), Disp: 90 capsule, Rfl: 0  Review of Systems  Social History   Tobacco Use  . Smoking status: Never Smoker  . Smokeless tobacco: Never  Used  Substance Use Topics  . Alcohol use: No    Frequency: Never   Objective:   BP 123/88 (BP Location: Left Arm, Patient Position: Sitting, Cuff Size: Large)   Pulse 96   Temp 98.4 F (36.9 C) (Oral)   Resp 16   Wt 169 lb (76.7 kg)   BMI 29.01 kg/m  Vitals:   06/19/18 1615  BP: 123/88  Pulse: 96  Resp: 16  Temp: 98.4 F (36.9 C)  TempSrc: Oral  Weight: 169 lb (76.7 kg)     Physical Exam  Constitutional: She is oriented to person, place, and time. She appears well-developed and well-nourished.  Cardiovascular: Normal rate.  Pulmonary/Chest: Effort normal.  Neurological: She is alert and oriented to person, place, and time.  Skin: Skin is warm and dry.  Psychiatric: Her behavior is normal. She exhibits a depressed mood.        Assessment & Plan:     1. Grief  Will have her continue zoloft 50 mg and attend her scheduled counseling session.  2. Type 2 diabetes mellitus with hyperglycemia, with long-term current use of insulin ([facility])  Follow up with Dr. Honor Junes in January 2020.   Return in about 5 months (around 11/18/2018) for cpe.  The entirety of the information documented in the History of Present Illness, Review of Systems and Physical Exam were personally obtained by me. Portions of this information were initially documented  by Joseline Rosas,CMA  and reviewed by me for thoroughness and accuracy.              Trinna Post, PA-C  [facility]

## 2018-06-19 NOTE — Progress Notes (Signed)
Emailed Nancy Marus in [place] on 06-05-18  requesting update on appt for pt with a counselor  at [facility] response that pt has appt with a counselor on 06-27-18.

## 2018-06-20 DIAGNOSIS — E108 Type 1 diabetes mellitus with unspecified complications: Secondary | ICD-10-CM | POA: Insufficient documentation

## 2018-06-20 NOTE — Patient Instructions (Signed)
Coping With Loss, Adult People experience loss in many different ways throughout their lives. Events such as moving, changing jobs, and losing friends can create a sense of loss. The loss may be as serious as a major health change, divorce, death of a pet, or death of a loved one. All of these types of loss are likely to create a physical and emotional reaction known as grief. Grief is the result of a major change or an absence of something or someone that you count on. Grief is a normal reaction to loss. How to recognize changes A variety of factors can affect your grieving experience, including:  The nature of your loss.  Your relationship to what or whom you lost.  Your understanding of grief and how to cope with it.  Your support system.  The way that you deal with your grief will affect your ability to function as you normally do. When you are grieving, you may experience:  Numbness, shock, sadness, anxiety, anger, denial, and guilt.  Thoughts about death.  Unexpected crying.  A physical sensation of emptiness in your gut.  Problems sleeping and eating.  Fatigue.  Loss of interest in normal activities.  Dreaming about or imagining seeing the person who died.  A need to remember what or whom you lost.  Difficulty thinking about anything other than your loss for a period of time.  Relief. If you have been expecting the loss for a while, you may feel a sense of relief when it happens.  Where to find support To get support for coping with loss:  Ask your health care provider for help and recommendations, such as grief counseling or therapy.  Think about joining a support group for people who are coping with loss.  Follow these instructions at home:  Be patient with yourself and others. Allow the grieving process to happen, and remember that grieving takes time. ? It is likely that you may never feel completely done with some grief. You may find a way to move on while  still cherishing memories and feelings about your loss. ? Accepting your loss is a process. It can take months or longer to adjust.  Express your feelings in healthy ways, such as: ? Talking with others about your loss. It may be helpful to find others who have had a similar loss, such as a support group. ? Writing down your feelings in a journal. ? Doing physical activities to release stress and emotional energy. ? Doing creative activities like painting, sculpting, or playing or listening to music. ? Practicing resilience. This is the ability to recover and adjust after facing challenges. Reading some resources that encourage resilience may help you to learn ways to practice those behaviors.  Keep to your normal routine as much as possible. If you have trouble focusing or doing normal activities, it is acceptable to take some time away from your normal routine.  Spend time with friends and loved ones.  Eat a healthy diet, get plenty of sleep, and rest when you feel tired. Where to find more information: You can find more information about coping with loss from:  American Society of Clinical Oncology: www.cancer.net  American Psychological Association: www.apa.org  Contact a health care provider if:  Your grief is extreme and keeps getting worse.  You have ongoing grief that does not improve.  Your body shows symptoms of grief, such as illness.  You feel depressed, anxious, or lonely. Get help right away if:  You have   thoughts about hurting yourself or others. If you ever feel like you may hurt yourself or others, or have thoughts about taking your own life, get help right away. You can go to your nearest emergency department or call:  Your local emergency services (911 in the [place]  A suicide crisis helpline, such as the National Suicide Prevention Lifeline at 1-800-273-8255. This is open 24 hours a day.  Summary  Grief is a normal part of experiencing a loss. It is the  result of a major change or an absence of something or someone that you count on.  The depth of grief and the period of recovery depend on the type of loss as well as your ability to adjust to the change and process your feelings.  Processing grief requires patience and a willingness to accept your feelings and talk about your loss with people who are supportive.  It is important to find resources that work for you and to realize that we are all different when it comes to grief. There is not one single grieving process that works for everyone in the same way.  Be aware that when grief becomes extreme, it can lead to more severe issues like isolation, depression, anxiety, or suicidal thoughts. Talk with your health care provider if you have any of these issues. This information is not intended to replace advice given to you by your health care provider. Make sure you discuss any questions you have with your health care provider. Document Released: 11/11/2016 Document Revised: 11/11/2016 Document Reviewed: 11/11/2016 Elsevier Interactive Patient Education  2018 Elsevier Inc.  

## 2018-07-10 ENCOUNTER — Encounter: Payer: Self-pay | Admitting: Dietician

## 2018-07-10 NOTE — Progress Notes (Signed)
Contacted [facility] and pt did go to appt with counselor on 06-05-18-Have not heard from pt -mailed discharge letter to MD

## 2018-07-11 ENCOUNTER — Encounter: Payer: Self-pay | Admitting: Dietician

## 2018-07-20 ENCOUNTER — Other Ambulatory Visit: Payer: Self-pay | Admitting: Physician Assistant

## 2018-07-20 DIAGNOSIS — E1100 Type 2 diabetes mellitus with hyperosmolarity without nonketotic hyperglycemic-hyperosmolar coma (NKHHC): Secondary | ICD-10-CM

## 2018-07-20 DIAGNOSIS — Z794 Long term (current) use of insulin: Principal | ICD-10-CM

## 2018-08-03 LAB — HM DIABETES EYE EXAM

## 2018-08-03 LAB — HEMOGLOBIN A1C: Hemoglobin A1C: 10.6

## 2018-08-17 LAB — HM DIABETES EYE EXAM

## 2018-08-21 ENCOUNTER — Other Ambulatory Visit: Payer: Self-pay | Admitting: Physician Assistant

## 2018-08-21 DIAGNOSIS — F4320 Adjustment disorder, unspecified: Secondary | ICD-10-CM

## 2018-08-22 ENCOUNTER — Telehealth: Payer: Self-pay | Admitting: Physician Assistant

## 2018-08-22 NOTE — Telephone Encounter (Signed)
Can we please abstract her A1c and foot exam and eye exam from [facility] clinic? Thanks.

## 2018-10-31 ENCOUNTER — Telehealth: Payer: Self-pay | Admitting: Physician Assistant

## 2018-10-31 NOTE — Telephone Encounter (Signed)
Can we please extract a1c from [facility] clinic and eye exam as well? I believe [facility] did her eye exam remotely, if it is not available in care everywhere can we please request eye exam from them?

## 2018-10-31 NOTE — Telephone Encounter (Signed)
A1C and eye exam was extracted.

## 2018-11-09 ENCOUNTER — Encounter: Payer: Self-pay | Admitting: Physician Assistant

## 2018-11-15 ENCOUNTER — Encounter: Payer: Self-pay | Admitting: *Deleted

## 2018-11-20 ENCOUNTER — Other Ambulatory Visit: Payer: Self-pay | Admitting: Physician Assistant

## 2018-11-20 DIAGNOSIS — F4320 Adjustment disorder, unspecified: Secondary | ICD-10-CM

## 2018-11-20 NOTE — Telephone Encounter (Signed)
Can we call and offer physical? She is due for CPE and some maintenance updates.

## 2018-11-20 NOTE — Telephone Encounter (Signed)
LOV 06/19/2018

## 2018-11-21 NOTE — Telephone Encounter (Signed)
No answer

## 2018-11-25 ENCOUNTER — Other Ambulatory Visit: Payer: Self-pay | Admitting: Physician Assistant

## 2018-11-27 NOTE — Telephone Encounter (Signed)
Please review

## 2018-12-27 LAB — HM DIABETES FOOT EXAM: HM Diabetic Foot Exam: NORMAL

## 2018-12-27 LAB — HEMOGLOBIN A1C: Hemoglobin A1C: 9.9

## 2018-12-27 LAB — MICROALBUMIN, URINE: Microalb, Ur: <7

## 2019-02-13 ENCOUNTER — Telehealth: Payer: Self-pay | Admitting: Physician Assistant

## 2019-02-13 ENCOUNTER — Other Ambulatory Visit: Payer: Self-pay | Admitting: Physician Assistant

## 2019-02-13 DIAGNOSIS — F4320 Adjustment disorder, unspecified: Secondary | ICD-10-CM

## 2019-02-13 NOTE — Telephone Encounter (Signed)
Please abstract A1c, urine micro and foot exam from [facility].

## 2019-02-23 ENCOUNTER — Other Ambulatory Visit: Payer: Self-pay

## 2019-02-23 ENCOUNTER — Telehealth: Payer: Self-pay

## 2019-02-23 ENCOUNTER — Ambulatory Visit: Payer: 59 | Admitting: Physician Assistant

## 2019-02-23 VITALS — BP 119/76 | HR 89 | Temp 95.9°F | Wt 180.0 lb

## 2019-02-23 DIAGNOSIS — E108 Type 1 diabetes mellitus with unspecified complications: Secondary | ICD-10-CM | POA: Diagnosis not present

## 2019-02-23 DIAGNOSIS — N3090 Cystitis, unspecified without hematuria: Secondary | ICD-10-CM

## 2019-02-23 LAB — POCT URINALYSIS DIPSTICK
Bilirubin, UA: NEGATIVE
Glucose, UA: NEGATIVE
Ketones, UA: NEGATIVE
Nitrite, UA: NEGATIVE
Protein, UA: NEGATIVE
Spec Grav, UA: 1.015 (ref 1.010–1.025)
Urobilinogen, UA: 0.2 E.U./dL
pH, UA: 5 (ref 5.0–8.0)

## 2019-02-23 MED ORDER — ONDANSETRON HCL 4 MG PO TABS: 4.0000 mg | ORAL_TABLET | Freq: Three times a day (TID) | ORAL | 0 refills | Status: DC | PRN

## 2019-02-23 MED ORDER — CEPHALEXIN 500 MG PO CAPS: 500.0000 mg | ORAL_CAPSULE | Freq: Two times a day (BID) | ORAL | 0 refills | Status: DC

## 2019-02-23 MED ORDER — SULFAMETHOXAZOLE-TRIMETHOPRIM 800-160 MG PO TABS: 1.0000 | ORAL_TABLET | Freq: Two times a day (BID) | ORAL | 0 refills | Status: AC

## 2019-02-23 NOTE — Addendum Note (Signed)
Addended by: Ashley Royalty E on: 02/23/2019 04:05 PM   Modules accepted: Orders

## 2019-02-23 NOTE — Patient Instructions (Signed)

## 2019-02-23 NOTE — Progress Notes (Signed)
Patient: Lindsey Hendricks Female    DOB: 02/27/1956   63 y.o.   MRN: [id number] Visit Date: 02/23/2019  Today's Provider: Trinna Post, PA-C   Chief Complaint  Patient presents with  . Urinary Tract Infection   Subjective:    History of type II DM and hospitalization with DKA after UTI  Urinary Tract Infection  This is a new problem. The current episode started 1 to 4 weeks ago. The problem has been gradually worsening. There has been no fever. Associated symptoms include frequency and urgency. Pertinent negatives include no chills, discharge, flank pain, hematuria, hesitancy, nausea, sweats or vomiting. She has tried increased fluids for the symptoms. The treatment provided no relief.    No Known Allergies   Current Outpatient Medications:  .  Insulin Degludec 200 UNIT/ML SOPN, Inject 50 Units into the skin daily., Disp: , Rfl:  .  insulin lispro (HUMALOG) 100 UNIT/ML KiwkPen, 7-8 units three times daily with meal times., Disp: 15 mL, Rfl: 11 .  meclizine (ANTIVERT) 25 MG tablet, Take 1 tablet (25 mg total) by mouth 3 (three) times daily as needed for dizziness., Disp: 30 tablet, Rfl: 1 .  metFORMIN (GLUCOPHAGE) 1000 MG tablet, TAKE 1 TAB BY MOUTH EVERY MORNING AND 1.5 TABS BY MOUTH EVERY EVENING, Disp: 225 tablet, Rfl: 1 .  rosuvastatin (CRESTOR) 20 MG tablet, TAKE 1 TABLET BY MOUTH EVERY DAY, Disp: 90 tablet, Rfl: 1 .  sertraline (ZOLOFT) 50 MG tablet, TAKE 1 TABLET BY MOUTH EVERY DAY, Disp: 90 tablet, Rfl: 0 .  Continuous Blood Gluc Sensor (FREESTYLE LIBRE 14 DAY SENSOR) MISC, USE 1 EACH EVERY 14 (FOURTEEN) DAYS, Disp: 1 each, Rfl: 4 .  gabapentin (NEURONTIN) 300 MG capsule, TAKE 1 CAPSULE BY MOUTH EVERYDAY AT BEDTIME (Patient not taking: Reported on 05/18/2018), Disp: 90 capsule, Rfl: 0 .  LEVEMIR FLEXTOUCH 100 UNIT/ML Pen, INJECT 30 UNITS INTO THE SKIN 2 (TWO) TIMES DAILY. (Patient not taking: Reported on 02/23/2019), Disp: 60 mL, Rfl: 0 .  sulfamethoxazole-trimethoprim  (BACTRIM DS) 800-160 MG tablet, Take 1 tablet by mouth 2 (two) times daily for 5 days., Disp: 10 tablet, Rfl: 0  Review of Systems  Constitutional: Negative for activity change, appetite change, chills, diaphoresis, fatigue, fever and unexpected weight change.  Gastrointestinal: Negative for abdominal distention, abdominal pain, anal bleeding, blood in stool, constipation, diarrhea, nausea, rectal pain and vomiting.  Genitourinary: Positive for difficulty urinating, dysuria, frequency and urgency. Negative for decreased urine volume, flank pain, hematuria, hesitancy, pelvic pain, vaginal bleeding, vaginal discharge and vaginal pain.  Neurological: Negative for dizziness, light-headedness and headaches.    Social History   Tobacco Use  . Smoking status: Never Smoker  . Smokeless tobacco: Never Used  Substance Use Topics  . Alcohol use: No    Frequency: Never      Objective:   BP 119/76 (BP Location: Right Arm, Patient Position: Sitting, Cuff Size: Large)   Pulse 89   Temp (!) 95.9 F (35.5 C) (Oral)   Wt 180 lb (81.6 kg)   BMI 30.90 kg/m  Vitals:   02/23/19 0824  BP: 119/76  Pulse: 89  Temp: (!) 95.9 F (35.5 C)  TempSrc: Oral  Weight: 180 lb (81.6 kg)     Physical Exam Constitutional:      Appearance: Normal appearance.  Cardiovascular:     Rate and Rhythm: Normal rate and regular rhythm.     Heart sounds: Normal heart sounds.  Pulmonary:  Breath sounds: Normal breath sounds.  Abdominal:     General: Abdomen is flat. Bowel sounds are normal.     Palpations: Abdomen is soft.     Tenderness: There is no right CVA tenderness or left CVA tenderness.  Skin:    General: Skin is warm and dry.  Neurological:     Mental Status: She is alert and oriented to person, place, and time. Mental status is at baseline.  Psychiatric:        Mood and Affect: Mood normal.      Results for orders placed or performed in visit on 02/23/19  POCT urinalysis dipstick  Result  Value Ref Range   Color, UA     Clarity, UA     Glucose, UA Negative Negative   Bilirubin, UA Negative    Ketones, UA Negative    Spec Grav, UA 1.015 1.010 - 1.025   Blood, UA Trace    pH, UA 5.0 5.0 - 8.0   Protein, UA Negative Negative   Urobilinogen, UA 0.2 0.2 or 1.0 E.U./dL   Nitrite, UA Negative    Leukocytes, UA Trace (A) Negative   Appearance     Odor         Assessment & Plan   .1. Cystitis  Will treat as below. Patient is monitoring her sugars every hour and they have been running ~148. She has ketone strips at home. Return precautions advised.   - POCT urinalysis dipstick - CULTURE, URINE COMPREHENSIVE - sulfamethoxazole-trimethoprim (BACTRIM DS) 800-160 MG tablet; Take 1 tablet by mouth 2 (two) times daily for 5 days.  Dispense: 10 tablet; Refill: 0  2. Type 1 diabetes mellitus with complications (HCC)  The entirety of the information documented in the History of Present Illness, Review of Systems and Physical Exam were personally obtained by me. Portions of this information were initially documented by Ashley Royalty, CMA and reviewed by me for thoroughness and accuracy.   F/u PRN    Trinna Post, PA-C  [facility]

## 2019-02-23 NOTE — Telephone Encounter (Signed)
Patient started Bactrim DS and is experiencing nausea, vomiting, and diarrhea after one dose of the medication. Can she be prescribed something else. Please advise.

## 2019-02-25 LAB — CULTURE, URINE COMPREHENSIVE

## 2019-03-08 NOTE — Progress Notes (Signed)
Patient: Lindsey Hendricks, Female    DOB: Dec 01, 1955, 63 y.o.   MRN: [id number] Visit Date: 03/09/2019  Today's Provider: Trinna Post, PA-C   Chief Complaint  Patient presents with  . Annual Exam   Subjective:     Annual physical exam Lindsey Hendricks is a 63 y.o. female who presents today for health maintenance and complete physical. She feels fairly well. She reports exercising none. She reports she is sleeping poorly.  Mammo: due, lumpectomy but was benign - this happened remotely when she was in her 20's Colonoscopy: 2012 normal, due 2022 PAP: has had complete hysterectomy  Using zoloft 50 mg daily for depression, doing well with this. Still going through grieving process of losing her husband.  Sleep study: positional sleep apnea diagnosed in a sleep study two years ago. At the time she weight 140 lbs and she currently weighs 180. She believes she snores at night. She does not wake up well rested and has the tendency to doze during the day.   Wt Readings from Last 3 Encounters:  03/09/19 179 lb (81.2 kg)  02/23/19 180 lb (81.6 kg)  06/19/18 169 lb (76.7 kg)    -------------------------------------------------------------   Review of Systems  Constitutional: Positive for fatigue.  All other systems reviewed and are negative.   Social History      She  reports that she has never smoked. She has never used smokeless tobacco. She reports that she does not drink alcohol or use drugs.       Social History   Socioeconomic History  . Marital status: Married    Spouse name: Not on file  . Number of children: Not on file  . Years of education: Not on file  . Highest education level: Not on file  Occupational History  . Not on file  Social Needs  . Financial resource strain: Not on file  . Food insecurity    Worry: Not on file    Inability: Not on file  . Transportation needs    Medical: Not on file    Non-medical: Not on file  Tobacco Use  . Smoking  status: Never Smoker  . Smokeless tobacco: Never Used  Substance and Sexual Activity  . Alcohol use: No    Frequency: Never  . Drug use: No  . Sexual activity: Not on file  Lifestyle  . Physical activity    Days per week: Not on file    Minutes per session: Not on file  . Stress: Not on file  Relationships  . Social Herbalist on phone: Not on file    Gets together: Not on file    Attends religious service: Not on file    Active member of club or organization: Not on file    Attends meetings of clubs or organizations: Not on file    Relationship status: Not on file  Other Topics Concern  . Not on file  Social History Narrative  . Not on file    Past Medical History:  Diagnosis Date  . Diabetes mellitus without complication ([place])   . Hyperlipidemia   . Meniere disease      Patient Active Problem List   Diagnosis Date Noted  . Type 1 diabetes mellitus with complications ([place]) [id number]  . DKA (diabetic ketoacidoses) ([place]) 08/10/2017    Past Surgical History:  Procedure Laterality Date  . ABDOMINAL HYSTERECTOMY    . ANKLE FRACTURE SURGERY Right   .  BREAST SURGERY Right   . CHOLECYSTECTOMY    . TONSILLECTOMY    . WRIST ARTHROSCOPY Left    Cyst     Family History        Family Status  Relation Name Status  . Father  Deceased  . Mother  Alive  . Sister  Alive  . MGM  Deceased  . MGF  Deceased        Her family history includes COPD in her sister; Congestive Heart Failure in her maternal grandfather and maternal grandmother; Healthy in her mother; Irregular heart beat in her mother; Lung cancer in her father.      No Known Allergies   Current Outpatient Medications:  .  Continuous Blood Gluc Sensor (FREESTYLE LIBRE 14 DAY SENSOR) MISC, USE 1 EACH EVERY 14 (FOURTEEN) DAYS, Disp: 1 each, Rfl: 4 .  Insulin Degludec 200 UNIT/ML SOPN, Inject 50 Units into the skin daily., Disp: , Rfl:  .  insulin lispro (HUMALOG) 100 UNIT/ML KiwkPen, 7-8 units  three times daily with meal times., Disp: 15 mL, Rfl: 11 .  meclizine (ANTIVERT) 25 MG tablet, Take 1 tablet (25 mg total) by mouth 3 (three) times daily as needed for dizziness., Disp: 30 tablet, Rfl: 1 .  metFORMIN (GLUCOPHAGE) 1000 MG tablet, TAKE 1 TAB BY MOUTH EVERY MORNING AND 1.5 TABS BY MOUTH EVERY EVENING, Disp: 225 tablet, Rfl: 1 .  rosuvastatin (CRESTOR) 20 MG tablet, TAKE 1 TABLET BY MOUTH EVERY DAY, Disp: 90 tablet, Rfl: 1 .  sertraline (ZOLOFT) 50 MG tablet, TAKE 1 TABLET BY MOUTH EVERY DAY, Disp: 90 tablet, Rfl: 0 .  cephALEXin (KEFLEX) 500 MG capsule, Take 1 capsule (500 mg total) by mouth 2 (two) times daily. (Patient not taking: Reported on 03/09/2019), Disp: 10 capsule, Rfl: 0 .  gabapentin (NEURONTIN) 300 MG capsule, TAKE 1 CAPSULE BY MOUTH EVERYDAY AT BEDTIME (Patient not taking: Reported on 05/18/2018), Disp: 90 capsule, Rfl: 0 .  LEVEMIR FLEXTOUCH 100 UNIT/ML Pen, INJECT 30 UNITS INTO THE SKIN 2 (TWO) TIMES DAILY. (Patient not taking: Reported on 02/23/2019), Disp: 60 mL, Rfl: 0 .  ondansetron (ZOFRAN) 4 MG tablet, Take 1 tablet (4 mg total) by mouth every 8 (eight) hours as needed for nausea or vomiting. (Patient not taking: Reported on 03/09/2019), Disp: 20 tablet, Rfl: 0   Patient Care Team: Paulene Floor as PCP - General (Physician Assistant)    Objective:    Vitals: BP 126/70 (BP Location: Right Arm, Patient Position: Sitting, Cuff Size: Large)   Pulse 92   Temp (!) 96.8 F (36 C) (Other (Comment))   Resp 16   Ht 5\' 4"  (1.626 m)   Wt 179 lb (81.2 kg)   SpO2 97%   BMI 30.73 kg/m    Vitals:   03/09/19 0913  BP: 126/70  Pulse: 92  Resp: 16  Temp: (!) 96.8 F (36 C)  TempSrc: Other (Comment)  SpO2: 97%  Weight: 179 lb (81.2 kg)  Height: 5\' 4"  (1.626 m)     Physical Exam Constitutional:      Appearance: Normal appearance.  Cardiovascular:     Rate and Rhythm: Normal rate and regular rhythm.     Heart sounds: Normal heart sounds.  Pulmonary:       Effort: Pulmonary effort is normal.     Breath sounds: Normal breath sounds.  Abdominal:     General: Bowel sounds are normal.     Palpations: Abdomen is soft.  Skin:  General: Skin is warm and dry.  Neurological:     Mental Status: She is alert and oriented to person, place, and time. Mental status is at baseline.  Psychiatric:        Mood and Affect: Mood normal.        Behavior: Behavior normal.      Depression Screen PHQ 2/9 Scores 03/09/2019 06/19/2018 03/06/2018 11/02/2017  PHQ - 2 Score 0 - 0 0  PHQ- 9 Score 3 - - 0  Exception Documentation - Patient refusal - -   Results of the Epworth flowsheet 03/09/2019  Sitting and reading 2  Watching TV 2  Sitting, inactive in a public place (e.g. a theatre or a meeting) 2  As a passenger in a car for an hour without a break 3  Lying down to rest in the afternoon when circumstances permit 3  Sitting and talking to someone 0  Sitting quietly after a lunch without alcohol 0  In a car, while stopped for a few minutes in traffic 0  Total score 12       Assessment & Plan:     Routine Health Maintenance and Physical Exam  Exercise Activities and Dietary recommendations Goals   None     Immunization History  Administered Date(s) Administered  . Pneumococcal Polysaccharide-23 11/02/2017  . Tdap 11/02/2017    Health Maintenance  Topic Date Due  . MAMMOGRAM  01/03/2008  . INFLUENZA VACCINE  02/10/2019  . HEMOGLOBIN A1C  06/28/2019  . OPHTHALMOLOGY EXAM  08/18/2019  . FOOT EXAM  12/27/2019  . URINE MICROALBUMIN  12/27/2019  . COLONOSCOPY  11/01/2020  . TETANUS/TDAP  11/03/2027  . PNEUMOCOCCAL POLYSACCHARIDE VACCINE AGE 29-64 HIGH RISK  Completed  . Hepatitis C Screening  Completed  . HIV Screening  Completed     Discussed health benefits of physical activity, and encouraged her to engage in regular exercise appropriate for her age and condition.    1. Annual physical exam  - MM Digital Screening;  Future  2. Need for influenza vaccination  - Flu Vaccine QUAD 36+ mos IM  3. Colon cancer screening   4. Breast cancer screening  [place] card given.  5. Meniere's disease of both ears  - meclizine (ANTIVERT) 25 MG tablet; Take 1 tablet (25 mg total) by mouth 3 (three) times daily as needed for dizziness.  Dispense: 30 tablet; Refill: 1  6. 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  Dispense: 225 tablet; Refill: 1  7. Adjustment disorder, unspecified type  - sertraline (ZOLOFT) 50 MG tablet; Take 1 tablet (50 mg total) by mouth daily.  Dispense: 90 tablet; Refill: 1  8. DM hyperosmolarity type II, uncontrolled (HCC)  - rosuvastatin (CRESTOR) 20 MG tablet; Take 1 tablet (20 mg total) by mouth daily.  Dispense: 90 tablet; Refill: 1  9. Other fatigue  Suspect she has sleep apnea. Her Epworth is 12 and she has gained 40 lbs since her prior sleep study. I also think depression could be partly contributing to this. She does not want to get sleep study just yet, wants to see if improvement in mood will help symptoms. She knows she can call back if she would like to schedule sleep study.   The entirety of the information documented in the History of Present Illness, Review of Systems and Physical Exam were personally  obtained by me. Portions of this information were initially documented by April M. Sabra Heck, CMA and reviewed by me for thoroughness and accuracy.   --------------------------------------------------------------------    Trinna Post, PA-C  [facility]

## 2019-03-09 ENCOUNTER — Ambulatory Visit (INDEPENDENT_AMBULATORY_CARE_PROVIDER_SITE_OTHER): Payer: 59 | Admitting: Physician Assistant

## 2019-03-09 ENCOUNTER — Encounter: Payer: Self-pay | Admitting: *Deleted

## 2019-03-09 ENCOUNTER — Other Ambulatory Visit: Payer: Self-pay

## 2019-03-09 ENCOUNTER — Encounter: Payer: Self-pay | Admitting: Physician Assistant

## 2019-03-09 VITALS — BP 126/70 | HR 92 | Temp 96.8°F | Resp 16 | Ht 64.0 in | Wt 179.0 lb

## 2019-03-09 DIAGNOSIS — Z23 Encounter for immunization: Secondary | ICD-10-CM | POA: Diagnosis not present

## 2019-03-09 DIAGNOSIS — Z1239 Encounter for other screening for malignant neoplasm of breast: Secondary | ICD-10-CM

## 2019-03-09 DIAGNOSIS — F4320 Adjustment disorder, unspecified: Secondary | ICD-10-CM

## 2019-03-09 DIAGNOSIS — Z1211 Encounter for screening for malignant neoplasm of colon: Secondary | ICD-10-CM | POA: Diagnosis not present

## 2019-03-09 DIAGNOSIS — H8103 Meniere's disease, bilateral: Secondary | ICD-10-CM

## 2019-03-09 DIAGNOSIS — E1100 Type 2 diabetes mellitus with hyperosmolarity without nonketotic hyperglycemic-hyperosmolar coma (NKHHC): Secondary | ICD-10-CM

## 2019-03-09 DIAGNOSIS — R5383 Other fatigue: Secondary | ICD-10-CM

## 2019-03-09 DIAGNOSIS — Z794 Long term (current) use of insulin: Secondary | ICD-10-CM

## 2019-03-09 DIAGNOSIS — Z0000 Encounter for general adult medical examination without abnormal findings: Secondary | ICD-10-CM

## 2019-03-09 DIAGNOSIS — E1165 Type 2 diabetes mellitus with hyperglycemia: Secondary | ICD-10-CM

## 2019-03-09 MED ORDER — SERTRALINE HCL 50 MG PO TABS: 50.0000 mg | ORAL_TABLET | Freq: Every day | ORAL | 1 refills | Status: DC

## 2019-03-09 MED ORDER — MECLIZINE HCL 25 MG PO TABS: 25.0000 mg | ORAL_TABLET | Freq: Three times a day (TID) | ORAL | 1 refills | Status: DC | PRN

## 2019-03-09 MED ORDER — ROSUVASTATIN CALCIUM 20 MG PO TABS: 20.0000 mg | ORAL_TABLET | Freq: Every day | ORAL | 1 refills | Status: DC

## 2019-03-09 MED ORDER — METFORMIN HCL 1000 MG PO TABS: ORAL_TABLET | ORAL | 1 refills | Status: DC

## 2019-03-09 NOTE — Patient Instructions (Signed)
Health Maintenance, Female Adopting a healthy lifestyle and getting preventive care are important in promoting health and wellness. Ask your health care provider about:  The right schedule for you to have regular tests and exams.  Things you can do on your own to prevent diseases and keep yourself healthy. What should I know about diet, weight, and exercise? Eat a healthy diet   Eat a diet that includes plenty of vegetables, fruits, low-fat dairy products, and lean protein.  Do not eat a lot of foods that are high in solid fats, added sugars, or sodium. Maintain a healthy weight Body mass index (BMI) is used to identify weight problems. It estimates body fat based on height and weight. Your health care provider can help determine your BMI and help you achieve or maintain a healthy weight. Get regular exercise Get regular exercise. This is one of the most important things you can do for your health. Most adults should:  Exercise for at least 150 minutes each week. The exercise should increase your heart rate and make you sweat (moderate-intensity exercise).  Do strengthening exercises at least twice a week. This is in addition to the moderate-intensity exercise.  Spend less time sitting. Even light physical activity can be beneficial. Watch cholesterol and blood lipids Have your blood tested for lipids and cholesterol at 61 years of age, then have this test every 5 years. Have your cholesterol levels checked more often if:  Your lipid or cholesterol levels are high.  You are older than 61 years of age.  You are at high risk for heart disease. What should I know about cancer screening? Depending on your health history and family history, you may need to have cancer screening at various ages. This may include screening for:  Breast cancer.  Cervical cancer.  Colorectal cancer.  Skin cancer.  Lung cancer. What should I know about heart disease, diabetes, and high blood  pressure? Blood pressure and heart disease  High blood pressure causes heart disease and increases the risk of stroke. This is more likely to develop in people who have high blood pressure readings, are of African descent, or are overweight.  Have your blood pressure checked: ? Every 3-5 years if you are 18-39 years of age. ? Every year if you are 40 years old or older. Diabetes Have regular diabetes screenings. This checks your fasting blood sugar level. Have the screening done:  Once every three years after age 40 if you are at a normal weight and have a low risk for diabetes.  More often and at a younger age if you are overweight or have a high risk for diabetes. What should I know about preventing infection? Hepatitis B If you have a higher risk for hepatitis B, you should be screened for this virus. Talk with your health care provider to find out if you are at risk for hepatitis B infection. Hepatitis C Testing is recommended for:  Everyone born from 1945 through 1965.  Anyone with known risk factors for hepatitis C. Sexually transmitted infections (STIs)  Get screened for STIs, including gonorrhea and chlamydia, if: ? You are sexually active and are younger than 61 years of age. ? You are older than 61 years of age and your health care provider tells you that you are at risk for this type of infection. ? Your sexual activity has changed since you were last screened, and you are at increased risk for chlamydia or gonorrhea. Ask your health care provider if   you are at risk.  Ask your health care provider about whether you are at high risk for HIV. Your health care provider may recommend a prescription medicine to help prevent HIV infection. If you choose to take medicine to prevent HIV, you should first get tested for HIV. You should then be tested every 3 months for as long as you are taking the medicine. Pregnancy  If you are about to stop having your period (premenopausal) and  you may become pregnant, seek counseling before you get pregnant.  Take 400 to 800 micrograms (mcg) of folic acid every day if you become pregnant.  Ask for birth control (contraception) if you want to prevent pregnancy. Osteoporosis and menopause Osteoporosis is a disease in which the bones lose minerals and strength with aging. This can result in bone fractures. If you are 65 years old or older, or if you are at risk for osteoporosis and fractures, ask your health care provider if you should:  Be screened for bone loss.  Take a calcium or vitamin D supplement to lower your risk of fractures.  Be given hormone replacement therapy (HRT) to treat symptoms of menopause. Follow these instructions at home: Lifestyle  Do not use any products that contain nicotine or tobacco, such as cigarettes, e-cigarettes, and chewing tobacco. If you need help quitting, ask your health care provider.  Do not use street drugs.  Do not share needles.  Ask your health care provider for help if you need support or information about quitting drugs. Alcohol use  Do not drink alcohol if: ? Your health care provider tells you not to drink. ? You are pregnant, may be pregnant, or are planning to become pregnant.  If you drink alcohol: ? Limit how much you use to 0-1 drink a day. ? Limit intake if you are breastfeeding.  Be aware of how much alcohol is in your drink. In the [place] one drink equals one 12 oz bottle of beer (355 mL), one 5 oz glass of wine (148 mL), or one 1 oz glass of hard liquor (44 mL). General instructions  Schedule regular health, dental, and eye exams.  Stay current with your vaccines.  Tell your health care provider if: ? You often feel depressed. ? You have ever been abused or do not feel safe at home. Summary  Adopting a healthy lifestyle and getting preventive care are important in promoting health and wellness.  Follow your health care provider's instructions about healthy  diet, exercising, and getting tested or screened for diseases.  Follow your health care provider's instructions on monitoring your cholesterol and blood pressure. This information is not intended to replace advice given to you by your health care provider. Make sure you discuss any questions you have with your health care provider. Document Released: 01/11/2011 Document Revised: 06/21/2018 Document Reviewed: 06/21/2018 Elsevier Patient Education  2020 Elsevier Inc.  

## 2019-08-08 LAB — HEMOGLOBIN A1C: Hemoglobin A1C: 8.7

## 2019-09-11 ENCOUNTER — Other Ambulatory Visit: Payer: Self-pay | Admitting: Physician Assistant

## 2019-09-11 DIAGNOSIS — E1100 Type 2 diabetes mellitus with hyperosmolarity without nonketotic hyperglycemic-hyperosmolar coma (NKHHC): Secondary | ICD-10-CM

## 2019-10-21 ENCOUNTER — Other Ambulatory Visit: Payer: Self-pay | Admitting: Physician Assistant

## 2019-10-21 DIAGNOSIS — F4320 Adjustment disorder, unspecified: Secondary | ICD-10-CM

## 2019-10-21 NOTE — Telephone Encounter (Signed)
30 day courtesy refill given Requested Prescriptions  Pending Prescriptions Disp Refills  . sertraline (ZOLOFT) 50 MG tablet [Pharmacy Med Name: SERTRALINE HCL 50 MG TABLET] 30 tablet 0    Sig: TAKE 1 TABLET BY MOUTH EVERY DAY     Psychiatry:  Antidepressants - SSRI Failed - 10/21/2019  9:38 AM      Failed - Valid encounter within last 6 months    Recent Outpatient Visits          7 months ago Annual physical exam   [facility] Carles Collet M, [place]   8 months ago [place], Wendee Beavers, [place]   1 year ago [place], [place], [place]   1 year ago Nasal swelling   [facility] Trinna Post, [place]   1 year ago Annual physical exam   [place], [place], [place]

## 2019-10-24 ENCOUNTER — Telehealth: Payer: Self-pay | Admitting: Physician Assistant

## 2019-10-24 NOTE — Telephone Encounter (Signed)
Labs was abstracted.

## 2019-10-24 NOTE — Telephone Encounter (Signed)
Can we abstract her diabetes measures from [facility] clinic? Thanks.

## 2019-11-15 ENCOUNTER — Other Ambulatory Visit: Payer: Self-pay | Admitting: Physician Assistant

## 2019-11-15 DIAGNOSIS — F4320 Adjustment disorder, unspecified: Secondary | ICD-10-CM

## 2019-11-15 NOTE — Telephone Encounter (Signed)
Requested medication (s) are due for refill today -yes  Requested medication (s) are on the active medication list -yes  Future visit scheduled -no  Last refill: 10/21/19  Notes to clinic: Patient has received 30 day courtesy refill- request from pharmacy sent for review  Requested Prescriptions  Pending Prescriptions Disp Refills   sertraline (ZOLOFT) 50 MG tablet [Pharmacy Med Name: SERTRALINE HCL 50 MG TABLET] 90 tablet 1    Sig: TAKE 1 TABLET BY MOUTH EVERY DAY      Psychiatry:  Antidepressants - SSRI Failed - 11/15/2019  1:32 PM      Failed - Valid encounter within last 6 months    Recent Outpatient Visits           8 months ago Annual physical exam   [facility] Carles Collet M, PA-C   8 months ago [place], Wendee Beavers, [place]   1 year ago [place], [place], [place]   1 year ago Nasal swelling   [place], [place], [place]   2 years ago Annual physical exam   [place], Adriana M, [place]                  Requested Prescriptions  Pending Prescriptions Disp Refills   sertraline (ZOLOFT) 50 MG tablet [Pharmacy Med Name: SERTRALINE HCL 50 MG TABLET] 90 tablet 1    Sig: TAKE 1 TABLET BY [place]      Psychiatry:  Antidepressants - SSRI Failed - 11/15/2019  1:32 PM      Failed - Valid encounter within last 6 months    Recent Outpatient Visits           8 months ago Annual physical exam   [facility] [place], Fabio Bering M, [place]   8 months ago [place], Wendee Beavers, [place]   1 year ago [place], [place], [place]   1 year ago Nasal swelling   [facility] [place], Wendee Beavers, [place]   2 years ago Annual physical exam   [place], [place], [place]

## 2019-11-16 NOTE — Telephone Encounter (Signed)
LOV: 03/09/19

## 2019-11-20 ENCOUNTER — Other Ambulatory Visit: Payer: Self-pay | Admitting: Physician Assistant

## 2019-11-20 DIAGNOSIS — F4320 Adjustment disorder, unspecified: Secondary | ICD-10-CM

## 2019-12-05 LAB — MICROALBUMIN, URINE: Microalb, Ur: <7

## 2019-12-06 ENCOUNTER — Other Ambulatory Visit: Payer: Self-pay | Admitting: Physician Assistant

## 2019-12-06 DIAGNOSIS — Z794 Long term (current) use of insulin: Secondary | ICD-10-CM

## 2019-12-13 ENCOUNTER — Other Ambulatory Visit: Payer: Self-pay

## 2019-12-13 ENCOUNTER — Encounter: Payer: Self-pay | Admitting: Physician Assistant

## 2019-12-13 ENCOUNTER — Ambulatory Visit (INDEPENDENT_AMBULATORY_CARE_PROVIDER_SITE_OTHER): Payer: No Typology Code available for payment source | Admitting: Physician Assistant

## 2019-12-13 VITALS — BP 109/77 | HR 96 | Temp 96.6°F | Resp 16 | Wt 187.0 lb

## 2019-12-13 DIAGNOSIS — F4323 Adjustment disorder with mixed anxiety and depressed mood: Secondary | ICD-10-CM

## 2019-12-13 DIAGNOSIS — E1169 Type 2 diabetes mellitus with other specified complication: Secondary | ICD-10-CM

## 2019-12-13 DIAGNOSIS — Z1231 Encounter for screening mammogram for malignant neoplasm of breast: Secondary | ICD-10-CM | POA: Diagnosis not present

## 2019-12-13 DIAGNOSIS — E785 Hyperlipidemia, unspecified: Secondary | ICD-10-CM

## 2019-12-13 DIAGNOSIS — E1100 Type 2 diabetes mellitus with hyperosmolarity without nonketotic hyperglycemic-hyperosmolar coma (NKHHC): Secondary | ICD-10-CM

## 2019-12-13 DIAGNOSIS — L989 Disorder of the skin and subcutaneous tissue, unspecified: Secondary | ICD-10-CM

## 2019-12-13 DIAGNOSIS — E1165 Type 2 diabetes mellitus with hyperglycemia: Secondary | ICD-10-CM

## 2019-12-13 NOTE — Progress Notes (Signed)
Established patient visit   Patient: Lindsey Hendricks   DOB: [birth year]   64 y.o. Female  MRN: [id number] Visit Date: 12/13/2019  Today's healthcare provider: Trinna Post, PA-C   Chief Complaint  Patient presents with  . Follow-up   Subjective    HPI   COVID vaccine 2nd dose around April or may 2021.   Follow up for Adjustment Disorder:  The patient was last seen for this 10 months ago. Changes made at last visit include none; continue same medication.  She reports good compliance with treatment. She feels that condition is Improved. She is not having side effects.   ----------------------------------------------------------------------------------------- Skin Lesion: Patient complains of an itchy skin lesion on the left side of her chest. Skin lesion appeared 1 year ago and has increased in size. She has tried applying neosporin with no improvement. She would like a referral to Dermatology.   Diabetes Mellitus Type II, Follow-up: Eye exam scheduled for 03/2020  Lab Results  Component Value Date   HGBA1C 8.7 08/08/2019   HGBA1C 9.9 12/27/2018   HGBA1C 10.6 08/03/2018   Wt Readings from Last 3 Encounters:  12/13/19 187 lb (84.8 kg)  03/09/19 179 lb (81.2 kg)  02/23/19 180 lb (81.6 kg)   Last seen for diabetes 1 months ago with endocrinology/ Management since then includes insulin adjustment. She reports good compliance with treatment. She is not having side effects.  Symptoms: No fatigue No foot ulcerations  No appetite changes No nausea  Yes paresthesia of the feet  No polydipsia  No polyuria No visual disturbances   No vomiting     Home blood sugar records: trend: stable  Episodes of hypoglycemia? No    Current insulin regiment: degludec 30 units daily. Novolog based on sliding scale determined by endocrinology. Most Recent Eye Exam: Scheduled 03/2020 with [facility] Current exercise: none Current diet habits: not asked  Pertinent Labs: Lab  Results  Component Value Date   CHOL 208 (H) 08/23/2017   HDL 96 08/23/2017   LDLCALC 88 08/23/2017   TRIG 121 08/23/2017   CHOLHDL 2.2 08/23/2017   Lab Results  Component Value Date   NA 141 08/23/2017   K 4.4 08/23/2017   CREATININE 0.88 08/23/2017   GFRNONAA 72 08/23/2017   GFRAA 83 08/23/2017   GLUCOSE 324 (H) 08/23/2017     Lipid/Cholesterol, Follow-up  Last lipid panel Other pertinent labs  Lab Results  Component Value Date   CHOL 208 (H) 08/23/2017   HDL 96 08/23/2017   LDLCALC 88 08/23/2017   TRIG 121 08/23/2017   CHOLHDL 2.2 08/23/2017   Lab Results  Component Value Date   ALT 20 08/23/2017   AST 22 08/23/2017   PLT 288 08/10/2017     She was last seen for this 10 months ago.  Management since that visit includes crestor 20 mg nightly.  She reports excellent compliance with treatment. She is not having side effects.   Symptoms: No chest pain No chest pressure/discomfort  No dyspnea No lower extremity edema  No numbness or tingling of extremity No orthopnea  No palpitations No paroxysmal nocturnal dyspnea  No speech difficulty No syncope   Current diet: not asked Current exercise: none  The 10-year ASCVD risk score Mikey Bussing DC Jr., et al., 2013) is: 3.7%  ---------------------------------------------------------------------------------------------------  ---------------------------------------------------------------------------------------------------     Medications: Outpatient Medications Prior to Visit  Medication Sig  . Continuous Blood Gluc Sensor (DEXCOM G6 SENSOR) MISC Use to monitor blood sugar.  Replace every 10 days  . Insulin Degludec 200 UNIT/ML SOPN Inject 30 Units into the skin daily.   . meclizine (ANTIVERT) 25 MG tablet Take 1 tablet (25 mg total) by mouth 3 (three) times daily as needed for dizziness.  . metFORMIN (GLUCOPHAGE) 1000 MG tablet TAKE 1 TABLET IN THE MORNING THEN TAKE 1 1/2 TABS IN THE EVENING.  Marland Kitchen NOVOLOG FLEXPEN 100  UNIT/ML FlexPen UP TO 50 UNITS DAILY BASED ON CARB INTAKE AND BLOOD SUGAR  . rosuvastatin (CRESTOR) 20 MG tablet TAKE 1 TABLET BY MOUTH EVERY DAY  . sertraline (ZOLOFT) 50 MG tablet TAKE 1 TABLET BY MOUTH EVERY DAY  . [DISCONTINUED] Continuous Blood Gluc Sensor (FREESTYLE LIBRE 14 DAY SENSOR) MISC USE 1 EACH EVERY 14 (FOURTEEN) DAYS  . [DISCONTINUED] insulin lispro (HUMALOG) 100 UNIT/ML KiwkPen 7-8 units three times daily with meal times. (Patient not taking: Reported on 12/13/2019)   No facility-administered medications prior to visit.    Review of Systems  Constitutional: Negative for appetite change, chills, fatigue and fever.  Respiratory: Negative for chest tightness and shortness of breath.   Cardiovascular: Negative for chest pain and palpitations.  Gastrointestinal: Negative for abdominal pain, nausea and vomiting.  Skin:       Skin lesion on chest  Neurological: Negative for dizziness and weakness.      Objective    BP 109/77 (BP Location: Left Arm, Patient Position: Sitting, Cuff Size: Large)   Pulse 96   Temp (!) 96.6 F (35.9 C) (Temporal)   Resp 16   Wt 187 lb (84.8 kg)   BMI 32.10 kg/m    Physical Exam Constitutional:      Appearance: Normal appearance.  Cardiovascular:     Rate and Rhythm: Normal rate and regular rhythm.     Heart sounds: Normal heart sounds.  Pulmonary:     Effort: Pulmonary effort is normal.     Breath sounds: Normal breath sounds.  Chest:    Skin:    General: Skin is warm and dry.  Neurological:     Mental Status: She is alert and oriented to person, place, and time. Mental status is at baseline.  Psychiatric:        Mood and Affect: Mood normal.        Behavior: Behavior normal.       Results for orders placed or performed in visit on 12/13/19  Microalbumin, urine  Result Value Ref Range   Microalb, Ur <7     Assessment & Plan    1. DM hyperosmolarity type II, uncontrolled ([place])  Followed by [facility].    2. Encounter for screening mammogram for malignant neoplasm of breast  - MM Digital Screening; Future  3. Skin lesion  - Ambulatory referral to Dermatology  4. Hyperlipidemia associated with type 2 diabetes mellitus (HCC)  Continue Crestor. Lipid profile with [place] shows controlled HLD.   5. Adjustment reaction with anxiety and depression   Continue Zoloft.   Return in about 1 year (around 12/12/2020) for CPE and follow up .      ITrinna Post, PA-C, have reviewed all documentation for this visit. The documentation on 12/14/19 for the exam, diagnosis, procedures, and orders are all accurate and complete.    Paulene Floor  [facility] [phone] (phone) [phone] (fax)  [place]

## 2020-03-04 ENCOUNTER — Other Ambulatory Visit: Payer: Self-pay | Admitting: Physician Assistant

## 2020-03-04 DIAGNOSIS — Z794 Long term (current) use of insulin: Secondary | ICD-10-CM

## 2020-03-04 DIAGNOSIS — E1100 Type 2 diabetes mellitus with hyperosmolarity without nonketotic hyperglycemic-hyperosmolar coma (NKHHC): Secondary | ICD-10-CM

## 2020-03-04 NOTE — Telephone Encounter (Signed)
Requested Prescriptions  Pending Prescriptions Disp Refills  . metFORMIN (GLUCOPHAGE) 1000 MG tablet [Pharmacy Med Name: METFORMIN HCL 1,000 MG TABLET] 225 tablet 1    Sig: TAKE 1 TABLET IN THE MORNING THEN TAKE 1 1/2 TABS IN THE EVENING.     Endocrinology:  Diabetes - Biguanides Failed - 03/04/2020  1:20 AM      Failed - Cr in normal range and within 360 days    Creatinine, Ser  Date Value Ref Range Status  08/23/2017 0.88 0.57 - 1.00 mg/dL Final         Failed - HBA1C is between 0 and 7.9 and within 180 days    Hemoglobin A1C  Date Value Ref Range Status  08/08/2019 8.7  Final         Failed - eGFR in normal range and within 360 days    GFR calc Af Amer  Date Value Ref Range Status  08/23/2017 83 >59 mL/min/1.73 Final   GFR calc non Af Amer  Date Value Ref Range Status  08/23/2017 72 >59 mL/min/1.73 Final         Passed - Valid encounter within last 6 months    Recent Outpatient Visits          2 months ago DM hyperosmolarity type II, uncontrolled [facility])   [facility] Carles Collet M, [place]   12 months ago Annual physical exam   [facility] [place], Wendee Beavers, [place]   1 year ago [place], [place], [place]   1 year ago [place], [place], [place]   1 year ago Nasal swelling   [facility] Trinna Post, [place]      Future Appointments            In 3 weeks Laurence Ferrari, [place], [place]   In 9 months Trinna Post, PA-C [employer], [facility]           NOTE:  Patient had all qualifying labs per protocol at [facility] labs on 12/06/2019.  Hgb A1C was 8.7.

## 2020-03-07 ENCOUNTER — Other Ambulatory Visit: Payer: Self-pay | Admitting: Physician Assistant

## 2020-03-07 DIAGNOSIS — E1165 Type 2 diabetes mellitus with hyperglycemia: Secondary | ICD-10-CM

## 2020-03-07 NOTE — Telephone Encounter (Signed)
Requested  medications are  due for refill today yes  Requested medications are on the active medication list yes  Last refill 6/2  Last visit 12/2019  Next appt 12/2020  Notes to clinic Failed protocol due to last pertinent labs were 08/2017

## 2020-03-26 ENCOUNTER — Ambulatory Visit: Payer: 59 | Admitting: Dermatology

## 2020-03-26 ENCOUNTER — Other Ambulatory Visit: Payer: Self-pay

## 2020-03-26 DIAGNOSIS — D2339 Other benign neoplasm of skin of other parts of face: Secondary | ICD-10-CM | POA: Diagnosis not present

## 2020-03-26 DIAGNOSIS — D1800 Hemangioma unspecified site: Secondary | ICD-10-CM

## 2020-03-26 DIAGNOSIS — L578 Other skin changes due to chronic exposure to nonionizing radiation: Secondary | ICD-10-CM | POA: Diagnosis not present

## 2020-03-26 DIAGNOSIS — L820 Inflamed seborrheic keratosis: Secondary | ICD-10-CM

## 2020-03-26 DIAGNOSIS — D229 Melanocytic nevi, unspecified: Secondary | ICD-10-CM

## 2020-03-26 DIAGNOSIS — D369 Benign neoplasm, unspecified site: Secondary | ICD-10-CM

## 2020-03-26 DIAGNOSIS — L821 Other seborrheic keratosis: Secondary | ICD-10-CM

## 2020-03-26 MED ORDER — TRIAMCINOLONE ACETONIDE 0.1 % EX OINT: TOPICAL_OINTMENT | CUTANEOUS | 0 refills | Status: DC

## 2020-03-26 NOTE — Patient Instructions (Addendum)
Seborrheic Keratosis  What causes seborrheic keratoses? Seborrheic keratoses are harmless, common skin growths that first appear during adult life.  As time goes by, more growths appear.  Some people may develop a large number of them.  Seborrheic keratoses appear on both covered and uncovered body parts.  They are not caused by sunlight.  The tendency to develop seborrheic keratoses can be inherited.  They vary in color from skin-colored to gray, brown, or even black.  They can be either smooth or have a rough, warty surface.   Seborrheic keratoses are superficial and look as if they were stuck on the skin.  Under the microscope this type of keratosis looks like layers upon layers of skin.  That is why at times the top layer may seem to fall off, but the rest of the growth remains and re-grows.    Treatment Seborrheic keratoses do not need to be treated, but can easily be removed in the office.  Seborrheic keratoses often cause symptoms when they rub on clothing or jewelry.  Lesions can be in the way of shaving.  If they become inflamed, they can cause itching, soreness, or burning.  Removal of a seborrheic keratosis can be accomplished by freezing, burning, or surgery. If any spot bleeds, scabs, or grows rapidly, please return to have it checked, as these can be an indication of a skin cancer.  Cryotherapy Aftercare  . Wash gently with soap and water everyday.   Marland Kitchen Apply Vaseline and Band-Aid daily until healed.  Prior to procedure, discussed risks of blister formation, small wound, skin dyspigmentation, or rare scar following cryotherapy.   Once calmed down from LN2 may start [place] 0.1% ointment to affected area chest once daily and cover with band aid up to 2 weeks as needed for itch. Avoid applying to face, groin, and axilla. Use as directed. Risk of skin atrophy with long-term use reviewed.   Topical steroids (such as triamcinolone, fluocinolone, fluocinonide, mometasone, clobetasol,  halobetasol, betamethasone, hydrocortisone) can cause thinning and lightening of the skin if they are used for too long in the same area. Your physician has selected the right strength medicine for your problem and area affected on the body. Please use your medication only as directed by your physician to prevent side effects.

## 2020-03-26 NOTE — Progress Notes (Signed)
New Patient Visit  Subjective  Lindsey Hendricks is a 64 y.o. female who presents for the following: Lesion.  She has a spot at mid chest that has been there for about 1 1/2 years. Sometimes itches, no bleeding.  Has not been treated.  She says it comes and goes but never heals.  Patient also desires skin cancer screening for the upper body.    No personal or family history of skin cancer.   The following portions of the chart were reviewed this encounter and updated as appropriate:  Tobacco  Allergies  Meds  Problems  Med Hx  Surg Hx  Fam Hx      Review of Systems:  No other skin or systemic complaints except as noted in HPI or Assessment and Plan.  Objective  Well appearing patient in no apparent distress; mood and affect are within normal limits.  All skin waist up examined.  Objective  Left chest, right neck (2): Erythematous keratotic or waxy stuck-on papule or plaque.   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   Once calmed down from LN2 may start [place] 0.1% ointment to affected area chest once daily and cover with band aid up to 2 weeks as needed for itch.  Avoid rubbing or scratching that area. avoid applying to face, groin, and axilla. Use as directed. Risk of skin atrophy with long-term use reviewed.   Topical steroids (such as triamcinolone, fluocinolone, fluocinonide, mometasone, clobetasol, halobetasol, betamethasone, hydrocortisone) can cause thinning and lightening of the skin if they are used for too long in the same area. Your physician has selected the right strength medicine for your problem and area affected on the body. Please use your medication only as directed by your physician to prevent  side effects.     Destruction of lesion - Left chest, right neck Complexity: simple   Destruction method: cryotherapy   Informed consent: discussed and consent obtained   Lesion destroyed using liquid nitrogen: Yes   Cryotherapy cycles:  2 Outcome: patient tolerated procedure well with no complications   Post-procedure details: wound care instructions given    Ordered Medications: triamcinolone ointment (KENALOG) 0.1 %  Angiofibroma Left Alar Crease  Benign-appearing.  Observation.  Call clinic for new or changing lesions.  Recommend daily use of broad spectrum spf 30+ sunscreen to sun-exposed areas.    Actinic Damage - diffuse scaly erythematous macules with underlying dyspigmentation - Recommend daily broad spectrum sunscreen SPF 30+ to sun-exposed areas, reapply every 2 hours as needed.  - Call for new or changing lesions.  Melanocytic Nevi - Tan-brown and/or pink-flesh-colored symmetric macules and papules - Benign appearing on exam today - Observation - Call clinic for new or changing moles - Recommend daily use of broad spectrum spf 30+ sunscreen to sun-exposed areas.   Hemangiomas - Red papules - Discussed benign nature - Observe - Call for any changes  Seborrheic Keratoses - Stuck-on, waxy, tan-brown papules and plaques  - Discussed benign etiology and prognosis. - Observe - Call for any changes  Return in about 1 month (around 04/25/2020).  Graciella Belton, RMA, am acting as scribe for Forest Gleason, MD .  Documentation: I have reviewed the above documentation for accuracy and completeness, and I agree with the  above.  Forest Gleason, MD

## 2020-03-28 LAB — HM DIABETES EYE EXAM

## 2020-04-07 ENCOUNTER — Encounter: Payer: Self-pay | Admitting: Dermatology

## 2020-04-09 LAB — HEMOGLOBIN A1C: Hemoglobin A1C: 8.4

## 2020-04-10 ENCOUNTER — Other Ambulatory Visit: Payer: Self-pay | Admitting: Physician Assistant

## 2020-04-10 DIAGNOSIS — F4320 Adjustment disorder, unspecified: Secondary | ICD-10-CM

## 2020-04-30 ENCOUNTER — Encounter: Payer: Self-pay | Admitting: Dermatology

## 2020-04-30 ENCOUNTER — Ambulatory Visit: Payer: No Typology Code available for payment source | Admitting: Dermatology

## 2020-04-30 ENCOUNTER — Other Ambulatory Visit: Payer: Self-pay

## 2020-04-30 DIAGNOSIS — L820 Inflamed seborrheic keratosis: Secondary | ICD-10-CM

## 2020-04-30 NOTE — Progress Notes (Signed)
   Follow-Up Visit   Subjective  Lindsey Hendricks is a 64 y.o. female who presents for the following: Follow-up (OV 03/26/20).  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  Patient states that the areas have not resolved. Patient has also been using [place] 0.1% ointment to chest QD prn itch  The following portions of the chart were reviewed this encounter and updated as appropriate:  Tobacco  Allergies  Meds  Problems  Med Hx  Surg Hx  Fam Hx      Review of Systems:  No other skin or systemic complaints except as noted in HPI or Assessment and Plan.  Objective  Well appearing patient in no apparent distress; mood and affect are within normal limits.  A focused examination was performed including Chest and neck. Relevant physical exam findings are noted in the Assessment and Plan.  Objective  Left Chest, Left base of neck: Erythematous waxy stuck-on papule or plaque.    Assessment & Plan  Inflamed seborrheic keratosis (2) Left base of neck; Left Chest  Cryotherapy today Prior to procedure, discussed risks of blister formation, small wound, skin dyspigmentation, or rare scar following cryotherapy.    Destruction of lesion - Left Chest, Left base of neck  Destruction method: cryotherapy   Informed consent: discussed and consent obtained   Lesion destroyed using liquid nitrogen: Yes   Outcome: patient tolerated procedure well with no complications   Post-procedure details: wound care instructions given    Other Related Medications triamcinolone ointment (KENALOG) 0.1 %  Return in about 2 months (around 06/30/2020) for ISK f/u.  IDonzetta Kohut, CMA, am acting as scribe for Forest Gleason, MD .  Documentation: I have reviewed the above documentation for accuracy and completeness, and I agree with the above.  Forest Gleason, MD

## 2020-04-30 NOTE — Patient Instructions (Addendum)

## 2020-05-07 ENCOUNTER — Encounter: Payer: Self-pay | Admitting: Dermatology

## 2020-07-03 ENCOUNTER — Other Ambulatory Visit: Payer: Self-pay | Admitting: Physician Assistant

## 2020-07-03 DIAGNOSIS — F4320 Adjustment disorder, unspecified: Secondary | ICD-10-CM

## 2020-07-10 ENCOUNTER — Ambulatory Visit: Payer: No Typology Code available for payment source | Admitting: Dermatology

## 2020-07-15 ENCOUNTER — Ambulatory Visit: Payer: No Typology Code available for payment source | Admitting: Dermatology

## 2020-07-17 ENCOUNTER — Telehealth: Payer: Self-pay

## 2020-07-17 NOTE — Telephone Encounter (Signed)
Copied from CRM [id number]. Topic: General - Other >> Jul 17, 2020  3:51 PM Jaquita Rector A wrote: Reason for CRM: Patient called in to inquire of Osvaldo Angst can she send an Rx to the pharmacy for a UTI, has frequent urination, burns when it comes out need antibiotics and anti nausea meds. Per patient being diabetic she gets UTI often. Please advise   Ph# [phone]

## 2020-07-18 NOTE — Telephone Encounter (Signed)
Yes OV 

## 2020-07-18 NOTE — Telephone Encounter (Signed)
Patient was advised and scheduled appointment for 07/21/2020.

## 2020-07-21 ENCOUNTER — Ambulatory Visit: Payer: Managed Care, Other (non HMO) | Admitting: Physician Assistant

## 2020-07-21 ENCOUNTER — Encounter: Payer: Self-pay | Admitting: Physician Assistant

## 2020-07-21 ENCOUNTER — Other Ambulatory Visit: Payer: Self-pay

## 2020-07-21 VITALS — BP 121/90 | HR 97 | Temp 98.3°F | Wt 188.6 lb

## 2020-07-21 DIAGNOSIS — R300 Dysuria: Secondary | ICD-10-CM | POA: Diagnosis not present

## 2020-07-21 DIAGNOSIS — E108 Type 1 diabetes mellitus with unspecified complications: Secondary | ICD-10-CM

## 2020-07-21 LAB — POCT URINALYSIS DIPSTICK
Bilirubin, UA: NEGATIVE
Blood, UA: NEGATIVE
Glucose, UA: NEGATIVE
Ketones, UA: NEGATIVE
Leukocytes, UA: NEGATIVE
Nitrite, UA: NEGATIVE
Protein, UA: NEGATIVE
Spec Grav, UA: <=1.005 — AB (ref 1.010–1.025)
Urobilinogen, UA: 0.2 E.U./dL
pH, UA: 5 (ref 5.0–8.0)

## 2020-07-21 NOTE — Progress Notes (Signed)
Established patient visit   Patient: Lindsey Hendricks   DOB: September 25, 1955   65 y.o. Female  MRN: [id number] Visit Date: 07/21/2020  Today's healthcare provider: Trinna Post, PA-C   Chief Complaint  Patient presents with  . Dysuria   Subjective    Dysuria  This is a new problem. The current episode started in the past 7 days. The problem occurs every urination. The problem has been unchanged. The quality of the pain is described as burning. The pain is at a severity of 4/10. The pain is mild. There has been no fever. Associated symptoms include frequency, nausea and urgency. Pertinent negatives include no discharge or vomiting. She has tried increased fluids (cranberry juice) for the symptoms. The treatment provided mild relief.    Reports she is feeling better mostly.    Medications: Outpatient Medications Prior to Visit  Medication Sig  . Continuous Blood Gluc Sensor (DEXCOM G6 SENSOR) MISC Use to monitor blood sugar.  Replace every 10 days  . Insulin Degludec 200 UNIT/ML SOPN Inject 30 Units into the skin daily.   . meclizine (ANTIVERT) 25 MG tablet Take 1 tablet (25 mg total) by mouth 3 (three) times daily as needed for dizziness.  . metFORMIN (GLUCOPHAGE) 1000 MG tablet TAKE 1 TABLET IN THE MORNING THEN TAKE 1 1/2 TABS IN THE EVENING.  Marland Kitchen NOVOLOG FLEXPEN 100 UNIT/ML FlexPen UP TO 50 UNITS DAILY BASED ON CARB INTAKE AND BLOOD SUGAR  . rosuvastatin (CRESTOR) 20 MG tablet TAKE 1 TABLET BY MOUTH EVERY DAY  . sertraline (ZOLOFT) 50 MG tablet TAKE 1 TABLET BY MOUTH EVERY DAY  . triamcinolone ointment (KENALOG) 0.1 % Apply to affected area chest once daily and cover with band aid up to 2 weeks as needed for itch. Avoid applying to face, groin, and axilla. Use as directed. Risk of skin atrophy with long-term use reviewed.   No facility-administered medications prior to visit.    Review of Systems  Gastrointestinal: Positive for abdominal pain and nausea. Negative for vomiting.   Genitourinary: Positive for dysuria, frequency and urgency.  Musculoskeletal: Positive for back pain.      Objective    BP 121/90 (BP Location: Left Arm, Patient Position: Sitting, Cuff Size: Large)   Pulse 97   Temp 98.3 F (36.8 C) (Oral)   Wt 188 lb 9.6 oz (85.5 kg)   SpO2 100%   BMI 32.37 kg/m    Physical Exam Constitutional:      Appearance: Normal appearance.  Skin:    General: Skin is warm and dry.  Neurological:     General: No focal deficit present.     Mental Status: She is alert and oriented to person, place, and time.  Psychiatric:        Mood and Affect: Mood normal.        Behavior: Behavior normal.       Results for orders placed or performed in visit on 07/21/20  Hemoglobin A1c  Result Value Ref Range   Hemoglobin A1C 8.4   POCT urinalysis dipstick  Result Value Ref Range   Color, UA Yellow    Clarity, UA Clear    Glucose, UA Negative Negative   Bilirubin, UA Negative    Ketones, UA Negative    Spec Grav, UA <=1.005 (A) 1.010 - 1.025   Blood, UA Negative    pH, UA 5.0 5.0 - 8.0   Protein, UA Negative Negative   Urobilinogen, UA 0.2 0.2 or 1.0  E.U./dL   Nitrite, UA Negative    Leukocytes, UA Negative Negative   Appearance     Odor      Assessment & Plan    1. Dysuria  UA negative, will send for culture. Rx abx if needed upon return of culture. She is coming 12/2020 for CPE.  - POCT urinalysis dipstick - Urine Culture  2. Type 1 diabetes mellitus with complications (HCC)    Return if symptoms worsen or fail to improve.      ITrinna Post, PA-C, have reviewed all documentation for this visit. The documentation on 07/21/20 for the exam, diagnosis, procedures, and orders are all accurate and complete.  The entirety of the information documented in the History of Present Illness, Review of Systems and Physical Exam were personally obtained by me. Portions of this information were initially documented by [facility] and  reviewed by me for thoroughness and accuracy.     Paulene Floor  [facility] [phone] (phone) [phone] (fax)  [place]

## 2020-07-22 ENCOUNTER — Ambulatory Visit: Payer: Self-pay | Admitting: Physician Assistant

## 2020-07-23 LAB — URINE CULTURE

## 2020-08-02 ENCOUNTER — Other Ambulatory Visit: Payer: Self-pay | Admitting: Physician Assistant

## 2020-08-02 DIAGNOSIS — F4320 Adjustment disorder, unspecified: Secondary | ICD-10-CM

## 2020-08-02 NOTE — Telephone Encounter (Signed)
Requested Prescriptions  Pending Prescriptions Disp Refills  . sertraline (ZOLOFT) 50 MG tablet [Pharmacy Med Name: SERTRALINE HCL 50 MG TABLET] 90 tablet 0    Sig: TAKE 1 TABLET BY MOUTH EVERY DAY     Psychiatry:  Antidepressants - SSRI Passed - 08/02/2020  9:32 AM      Passed - Valid encounter within last 6 months    Recent Outpatient Visits          1 week ago [place], [place], PA-C   7 months ago DM hyperosmolarity type II, uncontrolled [facility])   [facility] [place], Wendee Beavers, [place]   1 year ago Annual physical exam   [facility] [place], Wendee Beavers, [place]   1 year ago [place], Wendee Beavers, [place]   2 years ago [place], Wendee Beavers, [place]      Future Appointments            In 4 months Terrilee Croak, Wendee Beavers, [place], [place]

## 2020-08-31 ENCOUNTER — Other Ambulatory Visit: Payer: Self-pay | Admitting: Physician Assistant

## 2020-08-31 DIAGNOSIS — E1100 Type 2 diabetes mellitus with hyperosmolarity without nonketotic hyperglycemic-hyperosmolar coma (NKHHC): Secondary | ICD-10-CM

## 2020-08-31 DIAGNOSIS — Z794 Long term (current) use of insulin: Secondary | ICD-10-CM

## 2020-09-03 ENCOUNTER — Other Ambulatory Visit: Payer: Self-pay | Admitting: Physician Assistant

## 2020-09-03 DIAGNOSIS — E1165 Type 2 diabetes mellitus with hyperglycemia: Secondary | ICD-10-CM

## 2020-09-03 NOTE — Telephone Encounter (Signed)
Requested Prescriptions  Pending Prescriptions Disp Refills  . rosuvastatin (CRESTOR) 20 MG tablet [Pharmacy Med Name: ROSUVASTATIN CALCIUM 20 MG TAB] 90 tablet 1    Sig: TAKE 1 TABLET BY MOUTH EVERY DAY     Cardiovascular:  Antilipid - Statins Failed - 09/03/2020  3:31 AM      Failed - Total Cholesterol in normal range and within 360 days    Cholesterol, Total  Date Value Ref Range Status  08/23/2017 208 (H) 100 - 199 mg/dL Final         Failed - LDL in normal range and within 360 days    LDL Calculated  Date Value Ref Range Status  08/23/2017 88 0 - 99 mg/dL Final         Failed - HDL in normal range and within 360 days    HDL  Date Value Ref Range Status  08/23/2017 96 >39 mg/dL Final         Failed - Triglycerides in normal range and within 360 days    Triglycerides  Date Value Ref Range Status  08/23/2017 121 0 - 149 mg/dL Final         Passed - Patient is not pregnant      Passed - Valid encounter within last 12 months    Recent Outpatient Visits          1 month ago [place], [place], PA-C   8 months ago DM hyperosmolarity type II, uncontrolled [facility])   [facility] [place], Wendee Beavers, [place]   1 year ago Annual physical exam   [facility] [place], Wendee Beavers, [place]   1 year ago [place], Wendee Beavers, [place]   2 years ago [place], Wendee Beavers, [place]      Future Appointments            In 3 months Terrilee Croak, Wendee Beavers, [place], [place]

## 2020-09-26 LAB — HM DIABETES EYE EXAM

## 2020-11-17 ENCOUNTER — Other Ambulatory Visit: Payer: Self-pay | Admitting: Family Medicine

## 2020-11-17 DIAGNOSIS — F4320 Adjustment disorder, unspecified: Secondary | ICD-10-CM

## 2020-11-17 MED ORDER — SERTRALINE HCL 50 MG PO TABS: 1.0000 | ORAL_TABLET | Freq: Every day | ORAL | 0 refills | Status: DC

## 2020-11-17 NOTE — Telephone Encounter (Signed)
Copied from [place] [id number]. Topic: Quick Communication - Rx Refill/Question >> Nov 17, 2020  3:57 PM Leward Quan A wrote: Medication: sertraline (ZOLOFT) 50 MG tablet   Has the patient contacted their pharmacy? Yes.   (Agent: If no, request that the patient contact the pharmacy for the refill.) (Agent: If yes, when and what did the pharmacy advise?)  Preferred Pharmacy (with phone number or street name): [id number] - [place], [place]  Phone:  [phone] Fax:  [phone]     Agent: Please be advised that RX refills may take up to 3 business days. We ask that you follow-up with your pharmacy.

## 2020-11-17 NOTE — Telephone Encounter (Signed)
Requested Prescriptions  Pending Prescriptions Disp Refills  . sertraline (ZOLOFT) 50 MG tablet 90 tablet 0    Sig: Take 1 tablet (50 mg total) by mouth daily.     Psychiatry:  Antidepressants - SSRI Passed - 11/17/2020  5:56 PM      Passed - Valid encounter within last 6 months    Recent Outpatient Visits          3 months ago [place], [place], [place]   11 months ago DM hyperosmolarity type II, uncontrolled [facility])   [facility] [place], Wendee Beavers, [place]   1 year ago Annual physical exam   [facility] [place], [place]   1 year ago [place], Wendee Beavers, [place]   2 years ago [place], Wendee Beavers, PA-C      Future Appointments            In 7 months Bacigalupo, Dionne Bucy, MD [facility], [place]

## 2020-12-12 ENCOUNTER — Encounter: Payer: Self-pay | Admitting: Physician Assistant

## 2021-02-04 ENCOUNTER — Other Ambulatory Visit: Payer: Self-pay | Admitting: Family Medicine

## 2021-02-04 DIAGNOSIS — F4320 Adjustment disorder, unspecified: Secondary | ICD-10-CM

## 2021-02-04 NOTE — Telephone Encounter (Signed)
Requested medication (s) are due for refill today - yes  Requested medication (s) are on the active medication list -yes  Future visit scheduled -yes  Last refill: 11/17/20  Notes to clinic: Call to patient- she actually thought her appointment was September. Patient states she was told that was the first available appointment with PCP- and may have been for annual/physical. Patient states she has been on this medication since 2019 and is doing well- wants to know if it can be RF until her appointment. Will send request.  Requested Prescriptions  Pending Prescriptions Disp Refills   sertraline (ZOLOFT) 50 MG tablet [Pharmacy Med Name: SERTRALINE HCL 50 MG TABLET] 90 tablet 0    Sig: TAKE 1 TABLET BY MOUTH EVERY DAY      Psychiatry:  Antidepressants - SSRI Failed - 02/04/2021  2:26 PM      Failed - Valid encounter within last 6 months    Recent Outpatient Visits           6 months ago [place], [place], PA-C   1 year ago DM hyperosmolarity type II, uncontrolled [facility])   [facility] [place], Wendee Beavers, [place]   1 year ago Annual physical exam   [facility] [place], [place]   1 year ago [place], Wendee Beavers, [place]   2 years ago [place], Wendee Beavers, [place]       Future Appointments             In 4 months Bacigalupo, Dionne Bucy, MD [employer], [place]                 Requested Prescriptions  Pending Prescriptions Disp Refills   sertraline (ZOLOFT) 50 MG tablet [Pharmacy Med Name: SERTRALINE HCL 50 MG TABLET] 90 tablet 0    Sig: TAKE 1 [place]      Psychiatry:  Antidepressants - SSRI Failed - 02/04/2021  2:26 PM      Failed - Valid encounter within last 6 months    Recent Outpatient Visits           6 months ago [place], [place], PA-C   1 year ago DM hyperosmolarity  type II, uncontrolled [facility])   [facility] [place], Wendee Beavers, [place]   1 year ago Annual physical exam   [facility] [place], Wendee Beavers, [place]   1 year ago [place], Wendee Beavers, [place]   2 years ago [place], Wendee Beavers, [place]       Future Appointments             In 4 months Bacigalupo, Dionne Bucy, MD [facility] [place]

## 2021-02-27 ENCOUNTER — Telehealth: Payer: Self-pay | Admitting: Family Medicine

## 2021-02-27 ENCOUNTER — Telehealth: Payer: Self-pay | Admitting: Physician Assistant

## 2021-02-27 ENCOUNTER — Other Ambulatory Visit: Payer: Self-pay | Admitting: Family Medicine

## 2021-02-27 ENCOUNTER — Other Ambulatory Visit: Payer: Self-pay

## 2021-02-27 DIAGNOSIS — E1100 Type 2 diabetes mellitus with hyperosmolarity without nonketotic hyperglycemic-hyperosmolar coma (NKHHC): Secondary | ICD-10-CM

## 2021-02-27 DIAGNOSIS — E1165 Type 2 diabetes mellitus with hyperglycemia: Secondary | ICD-10-CM

## 2021-02-27 DIAGNOSIS — F4320 Adjustment disorder, unspecified: Secondary | ICD-10-CM

## 2021-02-27 DIAGNOSIS — Z794 Long term (current) use of insulin: Secondary | ICD-10-CM

## 2021-02-27 MED ORDER — ROSUVASTATIN CALCIUM 20 MG PO TABS: 20.0000 mg | ORAL_TABLET | Freq: Every day | ORAL | 1 refills | Status: DC

## 2021-02-27 MED ORDER — METFORMIN HCL 1000 MG PO TABS: ORAL_TABLET | ORAL | 1 refills | Status: DC

## 2021-02-27 NOTE — Telephone Encounter (Signed)
Left detailed message for patient advising her that medication will only be approved for 30days since she has a scheduled appt , but patient does not have appt until 06/23/21. Contacted patient to contact our office back and schedule sooner appt for evaluation by PA or NP. KW

## 2021-02-27 NOTE — Telephone Encounter (Signed)
[facility] faxed refill request for the following medications:   rosuvastatin (CRESTOR) 20 MG tablet    metFORMIN (GLUCOPHAGE) 1000 MG tablet    Please advise.

## 2021-02-27 NOTE — Telephone Encounter (Signed)
  Notes to clinic:  REQUEST FOR 90 DAYS PRESCRIPTION. DX Code Needed.   Requested Prescriptions  Pending Prescriptions Disp Refills   sertraline (ZOLOFT) 50 MG tablet [Pharmacy Med Name: SERTRALINE HCL 50 MG TABLET] 90 tablet 1    Sig: Take 1 tablet (50 mg total) by mouth daily. Please schedule office visit before anymore refills     Psychiatry:  Antidepressants - SSRI Failed - 02/27/2021 11:30 AM      Failed - Valid encounter within last 6 months    Recent Outpatient Visits           7 months ago [place], [place], PA-C   1 year ago DM hyperosmolarity type II, uncontrolled [facility])   [facility] [place], Wendee Beavers, [place]   1 year ago Annual physical exam   [facility] [place], [place]   2 years ago [place], Wendee Beavers, [place]   2 years ago [place], Wendee Beavers, [place]       Future Appointments             In 3 months Bacigalupo, Dionne Bucy, MD [facility], [place]

## 2021-03-23 NOTE — Telephone Encounter (Signed)
Patient called in to schedule an appointment so that she can get a refill on her medicationsertraline (ZOLOFT) 50 MG tablet  but nothing available till October the earliest. Asking for a call back if she may be able to get something with Tally Joe this week or sooner than October or December. Please call patient at  Ph# [phone]

## 2021-03-26 ENCOUNTER — Other Ambulatory Visit: Payer: Self-pay

## 2021-03-26 ENCOUNTER — Encounter: Payer: Self-pay | Admitting: Family Medicine

## 2021-03-26 ENCOUNTER — Ambulatory Visit: Payer: Managed Care, Other (non HMO) | Admitting: Family Medicine

## 2021-03-26 VITALS — BP 127/84 | HR 82 | Temp 97.4°F | Resp 16 | Ht 64.0 in | Wt 191.0 lb

## 2021-03-26 DIAGNOSIS — F4320 Adjustment disorder, unspecified: Secondary | ICD-10-CM

## 2021-03-26 DIAGNOSIS — E108 Type 1 diabetes mellitus with unspecified complications: Secondary | ICD-10-CM | POA: Diagnosis not present

## 2021-03-26 MED ORDER — SERTRALINE HCL 50 MG PO TABS: 50.0000 mg | ORAL_TABLET | Freq: Every day | ORAL | 3 refills | Status: DC

## 2021-03-26 NOTE — Progress Notes (Signed)
I,Lindsey Hendricks,acting as a scribe for Lindsey Durie, MD.,have documented all relevant documentation on the behalf of Lindsey Durie, MD,as directed by  Lindsey Durie, MD while in the presence of Lindsey Durie, MD. .  Established patient visit   Patient: Lindsey Hendricks   DOB: [birth year]   65 y.o. Female  MRN: [id number] Visit Date: 03/26/2021  Today's healthcare provider: [name], MD   Chief Complaint  Patient presents with   Follow-up   Depression   Subjective    HPI  Patient is a 65 year old who comes in today as she needs a refill of sertraline.  She is has not been seen here since 2020 for her depression.  She has been a type I diabetic for 17 years and is followed by endocrine.  Out of her meds for sertraline for a couple weeks and does not feel as well as when she is on the sertraline.  She certainly is not suicidal or homicidal.  She has a physical in December arranged. She has a flu shot arranged at work. Depression, Follow-up  She  was last seen for this 9 months ago by Carles Collet, PA.  Changes made at last visit include no medication changes.   She reports good compliance with treatment. She is not having side effects. none  She reports good tolerance of treatment. Current symptoms include: depressed mood She feels she is Worse since last visit.  Depression screen [facility] 2/9 03/26/2021 03/09/2019 03/06/2018  Decreased Interest 2 0 0  Down, Depressed, Hopeless 2 0 0  PHQ - 2 Score 4 0 0  Altered sleeping 1 0 -  Tired, decreased energy 1 3 -  Change in appetite 0 0 -  Feeling bad or failure about yourself  0 0 -  Trouble concentrating 0 0 -  Moving slowly or fidgety/restless 0 0 -  Suicidal thoughts 0 0 -  PHQ-9 Score 6 3 -  Difficult doing work/chores Not difficult at all Not difficult at all -       Medications: Outpatient Medications Prior to Visit  Medication Sig   Continuous Blood Gluc Sensor (DEXCOM G6 SENSOR) MISC  Use to monitor blood sugar.  Replace every 10 days   Insulin Degludec 200 UNIT/ML SOPN Inject 30 Units into the skin daily.    meclizine (ANTIVERT) 25 MG tablet Take 1 tablet (25 mg total) by mouth 3 (three) times daily as needed for dizziness.   metFORMIN (GLUCOPHAGE) 1000 MG tablet Take 1 tablet in am Take 1.5 tablet in pm   NOVOLOG FLEXPEN 100 UNIT/ML FlexPen UP TO 50 UNITS DAILY BASED ON CARB INTAKE AND BLOOD SUGAR   rosuvastatin (CRESTOR) 20 MG tablet Take 1 tablet (20 mg total) by mouth daily.   sertraline (ZOLOFT) 50 MG tablet Take 1 tablet (50 mg total) by mouth daily. Please schedule office visit before anymore refills   [DISCONTINUED] triamcinolone ointment (KENALOG) 0.1 % Apply to affected area chest once daily and cover with band aid up to 2 weeks as needed for itch. Avoid applying to face, groin, and axilla. Use as directed. Risk of skin atrophy with long-term use reviewed.   No facility-administered medications prior to visit.    Review of Systems  Constitutional:  Negative for activity change and fatigue.  Respiratory:  Negative for cough and shortness of breath.   Cardiovascular:  Negative for chest pain, palpitations and leg swelling.  Musculoskeletal:  Negative for arthralgias and joint swelling.  Neurological:  Negative for dizziness, light-headedness and headaches.  Psychiatric/Behavioral:  Negative for agitation, self-injury, sleep disturbance and suicidal ideas. The patient is not nervous/anxious.    Last hemoglobin A1c Lab Results  Component Value Date   HGBA1C 8.4 04/09/2020       Objective    BP 127/84 (BP Location: Right Arm, Patient Position: Sitting, Cuff Size: Large)   Pulse 82   Temp (!) 97.4 F (36.3 C) (Temporal)   Resp 16   Ht '5\' 4"'$  (1.626 m)   Wt 191 lb (86.6 kg)   SpO2 94%   BMI 32.79 kg/m  BP Readings from Last 3 Encounters:  03/26/21 127/84  07/21/20 121/90  12/13/19 109/77   Wt Readings from Last 3 Encounters:  03/26/21 191 lb (86.6  kg)  07/21/20 188 lb 9.6 oz (85.5 kg)  12/13/19 187 lb (84.8 kg)      Physical Exam Vitals reviewed.  Constitutional:      General: She is not in acute distress.    Appearance: She is well-developed.  HENT:     Head: Normocephalic and atraumatic.     Right Ear: Hearing normal.     Left Ear: Hearing normal.     Nose: Nose normal.  Eyes:     General: Lids are normal. No scleral icterus.       Right eye: No discharge.        Left eye: No discharge.     Conjunctiva/sclera: Conjunctivae normal.  Cardiovascular:     Rate and Rhythm: Normal rate and regular rhythm.  Pulmonary:     Effort: Pulmonary effort is normal. No respiratory distress.  Musculoskeletal:        General: Normal range of motion.  Skin:    Findings: No lesion or rash.  Neurological:     General: No focal deficit present.     Mental Status: She is alert and oriented to person, place, and time.  Psychiatric:        Speech: Speech normal.        Behavior: Behavior normal.        Thought Content: Thought content normal.        Judgment: Judgment normal.      No results found for any visits on 03/26/21.  Assessment & Plan     1. Adjustment disorder, unspecified type Refill sertraline today and follow-up with physical in December - sertraline (ZOLOFT) 50 MG tablet; Take 1 tablet (50 mg total) by mouth daily.  Dispense: 90 tablet; Refill: 3  2. 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 Honor Junes   No follow-ups on file.      I, Lindsey Durie, MD, have reviewed all documentation for this visit. The documentation on 03/26/21 for the exam, diagnosis, procedures, and orders are all accurate and complete.    Murl Zogg Cranford Mon, MD  [facility] [phone] (phone) [phone] (fax)  [place]

## 2021-05-08 LAB — HM DIABETES EYE EXAM

## 2021-05-25 ENCOUNTER — Telehealth: Payer: Self-pay

## 2021-05-25 ENCOUNTER — Ambulatory Visit: Payer: Self-pay | Admitting: *Deleted

## 2021-05-25 NOTE — Telephone Encounter (Signed)
Per agent: "Pt called and stated that she is having sinus pressure and headaches and would like clinical advice "  Pt reports dry cough, runny nose, sinus pressure, earache both ears and frontal headache. Denies fever, no wheezing, no CP or tightness. Reports facial pain 6/10 today. Has been taking Mucnex, ineffective. HAs not covid tested. No availability at practice. Advised E-visit, UC. Pt states will go to UC. Assured pt NT would route to practice for PCPs review.   Reason for Disposition  Earache  Answer Assessment - Initial Assessment Questions 1. LOCATION: "Where does it hurt?"      Facial, both ears, frontal headache 2. ONSET: "When did the sinus pain start?"  (e.g., hours, days)      Last Thursday 3. SEVERITY: "How bad is the pain?"   (Scale 1-10; mild, moderate or severe)   - MILD (1-3): doesn't interfere with normal activities    - MODERATE (4-7): interferes with normal activities (e.g., work or school) or awakens from sleep   - SEVERE (8-10): excruciating pain and patient unable to do any normal activities        Today 6/10  Weekend worse 4. RECURRENT SYMPTOM: "Have you ever had sinus problems before?" If Yes, ask: "When was the last time?" and "What happened that time?"      yes 5. NASAL CONGESTION: "Is the nose blocked?" If Yes, ask: "Can you open it or must you breathe through your mouth?"     Runny, watery 6. NASAL DISCHARGE: "Do you have discharge from your nose?" If so ask, "What color?"     Clear 7. FEVER: "Do you have a fever?" If Yes, ask: "What is it, how was it measured, and when did it start?"      no 8. OTHER SYMPTOMS: "Do you have any other symptoms?" (e.g., sore throat, cough, earache, difficulty breathing)     Dry cough, nasal drainage, both ears earache  Protocols used: Sinus Pain or Congestion-A-AH

## 2021-05-25 NOTE — Telephone Encounter (Signed)
Copied from [place] [phone] [phone]. Topic: Appointment Scheduling - Scheduling Inquiry for Clinic >> May 25, 2021  9:15 AM Rayann Heman wrote: Reason for CRM: Pt called and stated that she has a sinus infection and would like to be worked in today if possible.

## 2021-05-25 NOTE — Telephone Encounter (Signed)
Contacted patient and set up appt for 05/26/21. KW

## 2021-05-26 ENCOUNTER — Encounter: Payer: Self-pay | Admitting: Family Medicine

## 2021-05-26 ENCOUNTER — Other Ambulatory Visit: Payer: Self-pay

## 2021-05-26 ENCOUNTER — Ambulatory Visit: Payer: Managed Care, Other (non HMO) | Admitting: Family Medicine

## 2021-05-26 VITALS — BP 126/77 | HR 97 | Temp 97.8°F | Resp 16 | Wt 185.5 lb

## 2021-05-26 DIAGNOSIS — R062 Wheezing: Secondary | ICD-10-CM | POA: Diagnosis not present

## 2021-05-26 DIAGNOSIS — R051 Acute cough: Secondary | ICD-10-CM | POA: Insufficient documentation

## 2021-05-26 DIAGNOSIS — E108 Type 1 diabetes mellitus with unspecified complications: Secondary | ICD-10-CM

## 2021-05-26 DIAGNOSIS — J0100 Acute maxillary sinusitis, unspecified: Secondary | ICD-10-CM | POA: Diagnosis not present

## 2021-05-26 MED ORDER — AMOXICILLIN-POT CLAVULANATE 875-125 MG PO TABS: 1.0000 | ORAL_TABLET | Freq: Two times a day (BID) | ORAL | 0 refills | Status: DC

## 2021-05-26 MED ORDER — GUAIFENESIN-CODEINE 100-10 MG/5ML PO SOLN: 10.0000 mL | Freq: Every evening | ORAL | 0 refills | Status: DC | PRN

## 2021-05-26 MED ORDER — GUAIFENESIN-DM 100-10 MG/5ML PO SYRP: 10.0000 mL | ORAL_SOLUTION | ORAL | 0 refills | Status: DC | PRN

## 2021-05-26 MED ORDER — ALBUTEROL SULFATE HFA 108 (90 BASE) MCG/ACT IN AERS: 2.0000 | INHALATION_SPRAY | Freq: Four times a day (QID) | RESPIRATORY_TRACT | 2 refills | Status: DC | PRN

## 2021-05-26 NOTE — Assessment & Plan Note (Signed)
Denies highs, on CCM Hold off on prednisone at this time

## 2021-05-26 NOTE — Assessment & Plan Note (Signed)
Inhaler provided for additional wheezing and to assist with lingering symptoms

## 2021-05-26 NOTE — Assessment & Plan Note (Signed)
Cough syrup provided to assist with day/night symptoms Continue to recommend hydration to thin secretions

## 2021-05-26 NOTE — Progress Notes (Signed)
Established patient visit   Patient: Lindsey Hendricks   DOB: [birth year]   65 y.o. Female  MRN: [id number] Visit Date: 05/26/2021  Today's healthcare provider: Gwyneth Sprout, FNP   Chief Complaint  Patient presents with   Sinus Problem   Subjective    Sinus Problem This is a new problem. The current episode started in the past 7 days. The problem is unchanged. There has been no fever. Associated symptoms include congestion, coughing, ear pain, headaches, a hoarse voice, neck pain, sinus pressure and sneezing. Pertinent negatives include no chills, diaphoresis, shortness of breath, sore throat or swollen glands. Treatments tried: Mucinex. The treatment provided no relief.       Medications: Outpatient Medications Prior to Visit  Medication Sig   Continuous Blood Gluc Sensor (DEXCOM G6 SENSOR) MISC Use to monitor blood sugar.  Replace every 10 days   meclizine (ANTIVERT) 25 MG tablet Take 1 tablet (25 mg total) by mouth 3 (three) times daily as needed for dizziness.   metFORMIN (GLUCOPHAGE) 1000 MG tablet Take 1 tablet in am Take 1.5 tablet in pm   NOVOLOG FLEXPEN 100 UNIT/ML FlexPen UP TO 50 UNITS DAILY BASED ON CARB INTAKE AND BLOOD SUGAR   rosuvastatin (CRESTOR) 20 MG tablet Take 1 tablet (20 mg total) by mouth daily.   sertraline (ZOLOFT) 50 MG tablet Take 1 tablet (50 mg total) by mouth daily.   [DISCONTINUED] Insulin Degludec 200 UNIT/ML SOPN Inject 30 Units into the skin daily.    No facility-administered medications prior to visit.    Review of Systems  Constitutional:  Negative for chills and diaphoresis.  HENT:  Positive for congestion, ear pain, hoarse voice, sinus pressure and sneezing. Negative for sore throat.   Respiratory:  Positive for cough. Negative for shortness of breath.   Musculoskeletal:  Positive for neck pain.  Neurological:  Positive for headaches.      Objective    BP 126/77   Pulse 97   Temp 97.8 F (36.6 C) (Oral)   Resp 16   Wt 185 lb  8 oz (84.1 kg)   SpO2 97%   BMI 31.84 kg/m   Physical Exam Vitals and nursing note reviewed.  Constitutional:      General: She is not in acute distress.    Appearance: Normal appearance. She is obese. She is not ill-appearing, toxic-appearing or diaphoretic.  HENT:     Head: Normocephalic and atraumatic.     Salivary Glands: Right salivary gland is not diffusely enlarged or tender. Left salivary gland is not diffusely enlarged or tender.     Right Ear: Tympanic membrane, ear canal and external ear normal. There is no impacted cerumen.     Left Ear: Tympanic membrane, ear canal and external ear normal. There is no impacted cerumen.     Nose: Congestion and rhinorrhea present.     Right Sinus: Maxillary sinus tenderness present. No frontal sinus tenderness.     Left Sinus: Maxillary sinus tenderness present. No frontal sinus tenderness.     Mouth/Throat:     Mouth: Mucous membranes are moist.     Tongue: No lesions.     Pharynx: Posterior oropharyngeal erythema present. No oropharyngeal exudate.     Tonsils: No tonsillar exudate. 0 on the right. 0 on the left.  Eyes:     Extraocular Movements: Extraocular movements intact.     Conjunctiva/sclera: Conjunctivae normal.     Pupils: Pupils are equal, round, and reactive to light.  Neck:     Thyroid: No thyroid mass or thyromegaly.     Trachea: Trachea normal.  Cardiovascular:     Rate and Rhythm: Normal rate and regular rhythm.     Pulses: Normal pulses.     Heart sounds: Normal heart sounds. No murmur heard.   No friction rub. No gallop.  Pulmonary:     Effort: Pulmonary effort is normal. No respiratory distress.     Breath sounds: Normal breath sounds. No stridor. No wheezing, rhonchi or rales.     Comments: Pt reported intermittent cough/and AM wheeze r/t congestion Chest:     Chest wall: No tenderness.  Abdominal:     General: Bowel sounds are normal.     Palpations: Abdomen is soft.  Musculoskeletal:        General: No  swelling, tenderness, deformity or signs of injury. Normal range of motion.     Cervical back: Normal range of motion.     Right lower leg: No edema.     Left lower leg: No edema.  Skin:    General: Skin is warm and dry.     Capillary Refill: Capillary refill takes less than 2 seconds.     Coloration: Skin is not jaundiced or pale.     Findings: No bruising, erythema, lesion or rash.  Neurological:     General: No focal deficit present.     Mental Status: She is alert and oriented to person, place, and time. Mental status is at baseline.     Cranial Nerves: No cranial nerve deficit.     Sensory: No sensory deficit.     Motor: No weakness.     Coordination: Coordination normal.  Psychiatric:        Mood and Affect: Mood normal.        Behavior: Behavior normal.        Thought Content: Thought content normal.        Judgment: Judgment normal.      No results found for any visits on 05/26/21.  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      Vonna Kotyk, FNP, have reviewed all documentation for this visit. The documentation on 05/26/21 for the exam, diagnosis, procedures, and orders are all accurate and complete.    Gwyneth Sprout, [place]  [phone] (phone) [phone] (fax)  [place]

## 2021-05-26 NOTE — Assessment & Plan Note (Signed)
Ongoing congestion and sinus pressure Complaints of intermittent sneezing Treatment with ABX, continue to encourage fluid intake to ensure proper hydration

## 2021-06-16 ENCOUNTER — Other Ambulatory Visit: Payer: Self-pay | Admitting: Family Medicine

## 2021-06-16 DIAGNOSIS — Z1231 Encounter for screening mammogram for malignant neoplasm of breast: Secondary | ICD-10-CM

## 2021-06-23 ENCOUNTER — Ambulatory Visit
Admission: RE | Admit: 2021-06-23 | Discharge: 2021-06-23 | Disposition: A | Discharge status: Home / Self Care | Payer: Managed Care, Other (non HMO) | Source: Ambulatory Visit | Attending: Family Medicine | Admitting: Family Medicine

## 2021-06-23 ENCOUNTER — Other Ambulatory Visit: Payer: Self-pay

## 2021-06-23 ENCOUNTER — Encounter: Payer: Self-pay | Admitting: Family Medicine

## 2021-06-23 DIAGNOSIS — Z1231 Encounter for screening mammogram for malignant neoplasm of breast: Secondary | ICD-10-CM | POA: Diagnosis present

## 2021-06-25 ENCOUNTER — Other Ambulatory Visit: Payer: Self-pay | Admitting: Family Medicine

## 2021-06-25 DIAGNOSIS — N6310 Unspecified lump in the right breast, unspecified quadrant: Secondary | ICD-10-CM

## 2021-06-25 DIAGNOSIS — R928 Other abnormal and inconclusive findings on diagnostic imaging of breast: Secondary | ICD-10-CM

## 2021-07-01 ENCOUNTER — Ambulatory Visit: Payer: Managed Care, Other (non HMO)

## 2021-07-01 ENCOUNTER — Other Ambulatory Visit: Payer: Managed Care, Other (non HMO)

## 2021-07-15 ENCOUNTER — Ambulatory Visit
Admission: RE | Admit: 2021-07-15 | Discharge: 2021-07-15 | Disposition: A | Discharge status: Home / Self Care | Payer: Managed Care, Other (non HMO) | Source: Ambulatory Visit | Attending: Family Medicine | Admitting: Family Medicine

## 2021-07-15 ENCOUNTER — Other Ambulatory Visit: Payer: Self-pay

## 2021-07-15 DIAGNOSIS — N6310 Unspecified lump in the right breast, unspecified quadrant: Secondary | ICD-10-CM | POA: Insufficient documentation

## 2021-07-15 DIAGNOSIS — R928 Other abnormal and inconclusive findings on diagnostic imaging of breast: Secondary | ICD-10-CM

## 2021-07-16 ENCOUNTER — Other Ambulatory Visit: Payer: Self-pay | Admitting: Family Medicine

## 2021-07-16 DIAGNOSIS — N6310 Unspecified lump in the right breast, unspecified quadrant: Secondary | ICD-10-CM

## 2021-07-16 DIAGNOSIS — R928 Other abnormal and inconclusive findings on diagnostic imaging of breast: Secondary | ICD-10-CM

## 2021-07-21 ENCOUNTER — Ambulatory Visit
Admission: RE | Admit: 2021-07-21 | Discharge: 2021-07-21 | Disposition: A | Discharge status: Home / Self Care | Payer: Managed Care, Other (non HMO) | Source: Ambulatory Visit | Attending: Family Medicine | Admitting: Family Medicine

## 2021-07-21 ENCOUNTER — Other Ambulatory Visit: Payer: Self-pay

## 2021-07-21 DIAGNOSIS — R928 Other abnormal and inconclusive findings on diagnostic imaging of breast: Secondary | ICD-10-CM | POA: Diagnosis not present

## 2021-07-21 DIAGNOSIS — N6310 Unspecified lump in the right breast, unspecified quadrant: Secondary | ICD-10-CM | POA: Diagnosis present

## 2021-07-22 LAB — SURGICAL PATHOLOGY

## 2021-07-31 ENCOUNTER — Ambulatory Visit (INDEPENDENT_AMBULATORY_CARE_PROVIDER_SITE_OTHER): Payer: Managed Care, Other (non HMO) | Admitting: Family Medicine

## 2021-07-31 ENCOUNTER — Encounter: Payer: Self-pay | Admitting: Family Medicine

## 2021-07-31 ENCOUNTER — Other Ambulatory Visit: Payer: Self-pay

## 2021-07-31 VITALS — BP 123/78 | HR 98 | Temp 98.4°F | Resp 15 | Wt 186.3 lb

## 2021-07-31 DIAGNOSIS — H8103 Meniere's disease, bilateral: Secondary | ICD-10-CM

## 2021-07-31 DIAGNOSIS — Z0000 Encounter for general adult medical examination without abnormal findings: Secondary | ICD-10-CM

## 2021-07-31 DIAGNOSIS — E108 Type 1 diabetes mellitus with unspecified complications: Secondary | ICD-10-CM

## 2021-07-31 DIAGNOSIS — E785 Hyperlipidemia, unspecified: Secondary | ICD-10-CM | POA: Insufficient documentation

## 2021-07-31 DIAGNOSIS — Z1211 Encounter for screening for malignant neoplasm of colon: Secondary | ICD-10-CM

## 2021-07-31 DIAGNOSIS — C4441 Basal cell carcinoma of skin of scalp and neck: Secondary | ICD-10-CM | POA: Diagnosis not present

## 2021-07-31 DIAGNOSIS — E1169 Type 2 diabetes mellitus with other specified complication: Secondary | ICD-10-CM

## 2021-07-31 MED ORDER — MECLIZINE HCL 25 MG PO TABS: 25.0000 mg | ORAL_TABLET | ORAL | 0 refills | Status: DC | PRN

## 2021-07-31 MED ORDER — FLUOROURACIL 5 % EX CREA: TOPICAL_CREAM | Freq: Two times a day (BID) | CUTANEOUS | 0 refills | Status: DC

## 2021-07-31 NOTE — Assessment & Plan Note (Signed)
Due for screening; has no family

## 2021-07-31 NOTE — Assessment & Plan Note (Signed)
Recent grown  1.4cm x 0.9 cm Bright pink Blister Start 5% FU cream Prev tan, non raised

## 2021-07-31 NOTE — Assessment & Plan Note (Signed)
Goal of LDL <70 Refused panel repeat Had labs at endo

## 2021-07-31 NOTE — Assessment & Plan Note (Signed)

## 2021-07-31 NOTE — Progress Notes (Signed)
Complete physical exam   Patient: Lindsey Hendricks   DOB: 06/01/1956   66 y.o. Female  MRN: [id number] Visit Date: 07/31/2021  Today's healthcare provider: Gwyneth Sprout, FNP   Chief Complaint  Patient presents with   Annual Exam   Subjective     HPI  Lindsey Hendricks is a 66 y.o. female who presents today for a complete physical exam.  She reports consuming a general diet. The patient has a physically strenuous job, but has no regular exercise apart from work.  She generally feels fairly well. She reports sleeping poorly. She does  have additional problems to discuss today, patient states that two weeks ago she noticed freckle on the left side of her chest changing and states that skin was blistered.   Past Medical History:  Diagnosis Date   Diabetes mellitus without complication ([place])    Hyperlipidemia    Meniere disease    Past Surgical History:  Procedure Laterality Date   ABDOMINAL HYSTERECTOMY     ANKLE FRACTURE SURGERY Right    BREAST EXCISIONAL BIOPSY Right    @1988    CHOLECYSTECTOMY     TONSILLECTOMY     WRIST ARTHROSCOPY Left    Cyst    Social History   Socioeconomic History   Marital status: Married    Spouse name: Not on file   Number of children: Not on file   Years of education: Not on file   Highest education level: Not on file  Occupational History   Not on file  Tobacco Use   Smoking status: Never   Smokeless tobacco: Never  Vaping Use   Vaping Use: Never used  Substance and Sexual Activity   Alcohol use: No   Drug use: No   Sexual activity: Not on file  Other Topics Concern   Not on file  Social History Narrative   Not on file   Social Determinants of Health   Financial Resource Strain: Not on file  Food Insecurity: Not on file  Transportation Needs: Not on file  Physical Activity: Not on file  Stress: Not on file  Social Connections: Not on file  Intimate Partner Violence: Not on file   Family Status  Relation Name Status    Father  Deceased   Mother  Alive   Sister  Alive   MGM  Deceased   MGF  Deceased   Family History  Problem Relation Age of Onset   Lung cancer Father    Healthy Mother    Irregular heart beat Mother    COPD Sister    Congestive Heart Failure Maternal Grandmother    Congestive Heart Failure Maternal Grandfather    No Known Allergies  Patient Care Team: Gwyneth Sprout, FNP as PCP - General (Family Medicine)   Medications: Outpatient Medications Prior to Visit  Medication Sig   albuterol (VENTOLIN HFA) 108 (90 Base) MCG/ACT inhaler Inhale 2 puffs into the lungs every 6 (six) hours as needed for wheezing or shortness of breath.   Continuous Blood Gluc Sensor (DEXCOM G6 SENSOR) MISC Use to monitor blood sugar.  Replace every 10 days   HUMALOG 100 UNIT/ML injection SMARTSIG:0-90 Unit(s) SUB-Q Daily   metFORMIN (GLUCOPHAGE) 1000 MG tablet Take 1 tablet in am Take 1.5 tablet in pm   rosuvastatin (CRESTOR) 20 MG tablet Take 1 tablet (20 mg total) by mouth daily.   sertraline (ZOLOFT) 50 MG tablet Take 1 tablet (50 mg total) by mouth daily.   [  DISCONTINUED] meclizine (ANTIVERT) 25 MG tablet Take 1 tablet (25 mg total) by mouth 3 (three) times daily as needed for dizziness.   [DISCONTINUED] NOVOLOG FLEXPEN 100 UNIT/ML FlexPen UP TO 50 UNITS DAILY BASED ON CARB INTAKE AND BLOOD SUGAR   [DISCONTINUED] amoxicillin-clavulanate (AUGMENTIN) 875-125 MG tablet Take 1 tablet by mouth 2 (two) times daily.   [DISCONTINUED] guaiFENesin-codeine 100-10 MG/5ML syrup Take 10 mLs by mouth at bedtime and may repeat dose one time if needed.   [DISCONTINUED] guaiFENesin-dextromethorphan (ROBITUSSIN DM) 100-10 MG/5ML syrup Take 10 mLs by mouth every 4 (four) hours as needed for cough.   No facility-administered medications prior to visit.    Review of Systems    Objective    BP 123/78    Pulse 98    Temp 98.4 F (36.9 C) (Oral)    Resp 15    Wt 186 lb 4.8 oz (84.5 kg)    SpO2 100%    BMI 31.98 kg/m     Physical Exam Vitals and nursing note reviewed.  Constitutional:      General: She is awake. She is not in acute distress.    Appearance: Normal appearance. She is well-developed and well-groomed. She is obese. She is not ill-appearing, toxic-appearing or diaphoretic.  HENT:     Head: Normocephalic and atraumatic.     Jaw: There is normal jaw occlusion. No trismus, tenderness, swelling or pain on movement.     Right Ear: Hearing, tympanic membrane, ear canal and external ear normal. There is no impacted cerumen.     Left Ear: Hearing, tympanic membrane, ear canal and external ear normal. There is no impacted cerumen.     Nose: Nose normal. No congestion or rhinorrhea.     Right Turbinates: Not enlarged, swollen or pale.     Left Turbinates: Not enlarged, swollen or pale.     Right Sinus: No maxillary sinus tenderness or frontal sinus tenderness.     Left Sinus: No maxillary sinus tenderness or frontal sinus tenderness.     Mouth/Throat:     Lips: Pink.     Mouth: Mucous membranes are moist. No injury.     Tongue: No lesions.     Pharynx: Oropharynx is clear. Uvula midline. No pharyngeal swelling, oropharyngeal exudate, posterior oropharyngeal erythema or uvula swelling.     Tonsils: No tonsillar exudate or tonsillar abscesses.  Eyes:     General: Lids are normal. Lids are everted, no foreign bodies appreciated. Vision grossly intact. Gaze aligned appropriately. No allergic shiner or visual field deficit.       Right eye: No discharge.        Left eye: No discharge.     Extraocular Movements: Extraocular movements intact.     Conjunctiva/sclera: Conjunctivae normal.     Right eye: Right conjunctiva is not injected. No exudate.    Left eye: Left conjunctiva is not injected. No exudate.    Pupils: Pupils are equal, round, and reactive to light.  Neck:     Thyroid: No thyroid mass, thyromegaly or thyroid tenderness.     Vascular: No carotid bruit.     Trachea: Trachea normal.   Cardiovascular:     Rate and Rhythm: Normal rate and regular rhythm.     Pulses: Normal pulses.          Carotid pulses are 2+ on the right side and 2+ on the left side.      Radial pulses are 2+ on the right side and 2+ on the  left side.       Dorsalis pedis pulses are 2+ on the right side and 2+ on the left side.       Posterior tibial pulses are 2+ on the right side and 2+ on the left side.     Heart sounds: Normal heart sounds, S1 normal and S2 normal. No murmur heard.   No friction rub. No gallop.  Pulmonary:     Effort: Pulmonary effort is normal. No respiratory distress.     Breath sounds: Normal breath sounds and air entry. No stridor. No wheezing, rhonchi or rales.  Chest:     Chest wall: No tenderness.       Comments: Breast exam deferred; discussed self exam Abdominal:     General: Abdomen is flat. Bowel sounds are normal. There is no distension.     Palpations: Abdomen is soft. There is no mass.     Tenderness: There is no abdominal tenderness. There is no right CVA tenderness, left CVA tenderness, guarding or rebound.     Hernia: No hernia is present.  Genitourinary:    Comments: Exam deferred; denies complaints Musculoskeletal:        General: No swelling, tenderness, deformity or signs of injury. Normal range of motion.     Cervical back: Full passive range of motion without pain, normal range of motion and neck supple. No edema, rigidity or tenderness. No muscular tenderness.     Right lower leg: No edema.     Left lower leg: No edema.  Lymphadenopathy:     Cervical: No cervical adenopathy.     Right cervical: No superficial, deep or posterior cervical adenopathy.    Left cervical: No superficial, deep or posterior cervical adenopathy.  Skin:    General: Skin is warm and dry.     Capillary Refill: Capillary refill takes less than 2 seconds.     Coloration: Skin is not jaundiced or pale.     Findings: No bruising, erythema, lesion or rash.  Neurological:      General: No focal deficit present.     Mental Status: She is alert and oriented to person, place, and time. Mental status is at baseline.     GCS: GCS eye subscore is 4. GCS verbal subscore is 5. GCS motor subscore is 6.     Sensory: Sensation is intact. No sensory deficit.     Motor: Motor function is intact. No weakness.     Coordination: Coordination is intact. Coordination normal.     Gait: Gait is intact. Gait normal.  Psychiatric:        Attention and Perception: Attention and perception normal.        Mood and Affect: Mood and affect normal.        Speech: Speech normal.        Behavior: Behavior normal. Behavior is cooperative.        Thought Content: Thought content normal.        Cognition and Memory: Cognition and memory normal.        Judgment: Judgment normal.     Last depression screening scores PHQ 2/9 Scores 03/26/2021 03/09/2019 06/19/2018  PHQ - 2 Score 4 0 -  PHQ- 9 Score 6 3 -  Exception Documentation - - Patient refusal   Last fall risk screening Fall Risk  03/26/2021  Falls in the past year? 0  Number falls in past yr: 0  Injury with Fall? 0  Risk for fall due to : No Fall  Risks  Follow up Falls evaluation completed   Last Audit-C alcohol use screening Alcohol Use Disorder Test (AUDIT) 03/26/2021  1. How often do you have a drink containing alcohol? 0  2. How many drinks containing alcohol do you have on a typical day when you are drinking? 0  3. How often do you have six or more drinks on one occasion? 0  AUDIT-C Score 0   A score of 3 or more in women, and 4 or more in men indicates increased risk for alcohol abuse, EXCEPT if all of the points are from question 1   No results found for any visits on 07/31/21.  Assessment & Plan    Routine Health Maintenance and Physical Exam  Exercise Activities and Dietary recommendations  Goals   None     Immunization History  Administered Date(s) Administered   Influenza,inj,Quad PF,6+ Mos 03/09/2019    Pneumococcal Polysaccharide-23 11/02/2017   Tdap 11/02/2017    Health Maintenance  Topic Date Due   COVID-19 Vaccine (1) Never done   Zoster Vaccines- Shingrix (1 of 2) Never done   HEMOGLOBIN A1C  10/07/2020   COLONOSCOPY (Pts 45-50yrs Insurance coverage will need to be confirmed)  11/01/2020   URINE MICROALBUMIN  12/04/2020   FOOT EXAM  12/12/2020   INFLUENZA VACCINE  02/09/2021   OPHTHALMOLOGY EXAM  05/08/2022   MAMMOGRAM  06/24/2023   TETANUS/TDAP  11/03/2027   Hepatitis C Screening  Completed   HIV Screening  Completed   HPV VACCINES  Aged Out    Discussed health benefits of physical activity, and encouraged her to engage in regular exercise appropriate for her age and condition.  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[place])    Followed by endo Insulin pump 200u 3 days basal rate Auto alarms set <70 >200 3x/night- lows and highs, had oj and then nabs not manual set for 50 u/day Using more than 70 u/day Reports strict diet however poor water intake      Relevant Medications   HUMALOG 100 UNIT/ML injection   Hyperlipidemia associated with type 2 diabetes mellitus ([facility])    Goal of LDL <70 Refused panel repeat Had labs at endo       Relevant Medications   HUMALOG 100 UNIT/ML injection     Nervous and Auditory   Meniere's disease of both ears    Chronic, request refill of meds      Relevant Medications   meclizine (ANTIVERT) 25 MG tablet     Musculoskeletal and Integument   Basal cell carcinoma (BCC) of skin of neck    Recent grown  1.4cm x 0.9 cm Bright pink Blister Start 5% FU cream Prev tan, non raised       Relevant Medications   fluorouracil (EFUDEX) 5 % cream   meclizine (ANTIVERT) 25 MG tablet   Other Relevant Orders   Ambulatory referral to Dermatology     Other   Colon cancer screening    Due for screening; has no family      Relevant Orders   Cologuard   Annual  physical exam - Primary    Things to do to keep yourself healthy  - Exercise at least 30-45 minutes a day, 3-4 days a week.  - Eat a low-fat diet with lots of fruits and vegetables, up to 7-9 servings per day.  - Seatbelts can save your life. Wear them always.  -  Smoke detectors on every level of your home, check batteries every year.  - Eye Doctor - have an eye exam every 1-2 years  - Safe sex - if you may be exposed to STDs, use a condom.  - Alcohol -  If you drink, do it moderately, less than 2 drinks per day.  - [place]. Choose someone to speak for you if you are not able.  - Depression is common in our stressful world.If you're feeling down or losing interest in things you normally enjoy, please come in for a visit.  - Violence - If anyone is threatening or hurting you, please call immediately.          Return in about 6 months (around 01/28/2022) for chonic disease management.     Vonna Kotyk, FNP, have reviewed all documentation for this visit. The documentation on 07/31/21 for the exam, diagnosis, procedures, and orders are all accurate and complete.  Patient seen and examined by Tally Joe,  FNP note scribed by [employer], [place], [place] [phone] (phone) [phone] (fax)  [place]

## 2021-07-31 NOTE — Assessment & Plan Note (Signed)
Chronic, request refill of meds

## 2021-07-31 NOTE — Assessment & Plan Note (Signed)
Followed by endo Insulin pump 200u 3 days basal rate Auto alarms set <70 >200 3x/night- lows and highs, had oj and then nabs not manual set for 50 u/day Using more than 70 u/day Reports strict diet however poor water intake

## 2021-08-20 LAB — COLOGUARD: COLOGUARD: NEGATIVE

## 2021-08-30 ENCOUNTER — Other Ambulatory Visit: Payer: Self-pay | Admitting: Family Medicine

## 2021-08-30 DIAGNOSIS — E1165 Type 2 diabetes mellitus with hyperglycemia: Secondary | ICD-10-CM

## 2021-08-30 DIAGNOSIS — E1100 Type 2 diabetes mellitus with hyperosmolarity without nonketotic hyperglycemic-hyperosmolar coma (NKHHC): Secondary | ICD-10-CM

## 2021-08-30 DIAGNOSIS — Z794 Long term (current) use of insulin: Secondary | ICD-10-CM

## 2021-08-31 NOTE — Telephone Encounter (Signed)
Requested medication (s) are due for refill today: yes  Requested medication (s) are on the active medication list: yes  Last refill:  metformin- 02/27/21 #225 1 refill. Crestor- 02/27/21 #90 1 refills  Future visit scheduled: no  Notes to clinic:  last HbgA1C 04/09/20 , last labs 08/23/2017 . Do you want to refill Rx?     Requested Prescriptions  Pending Prescriptions Disp Refills   metFORMIN (GLUCOPHAGE) 1000 MG tablet [Pharmacy Med Name: METFORMIN HCL 1,000 MG TABLET] 225 tablet 1    Sig: Take 1 tablet in am Take 1.5 tablet in pm     Endocrinology:  Diabetes - Biguanides Failed - 08/30/2021  9:02 AM      Failed - Cr in normal range and within 360 days    Creatinine, Ser  Date Value Ref Range Status  08/23/2017 0.88 0.57 - 1.00 mg/dL Final          Failed - HBA1C is between 0 and 7.9 and within 180 days    Hemoglobin A1C  Date Value Ref Range Status  04/09/2020 8.4  Final          Failed - eGFR in normal range and within 360 days    GFR calc Af Amer  Date Value Ref Range Status  08/23/2017 83 >59 mL/min/1.73 Final   GFR calc non Af Amer  Date Value Ref Range Status  08/23/2017 72 >59 mL/min/1.73 Final          Failed - B12 Level in normal range and within 720 days    No results found for: VITAMINB12        Failed - CBC within normal limits and completed in the last 12 months    WBC  Date Value Ref Range Status  08/10/2017 8.4 3.6 - 11.0 K/uL Final   RBC  Date Value Ref Range Status  08/10/2017 4.91 3.80 - 5.20 MIL/uL Final   Hemoglobin  Date Value Ref Range Status  08/10/2017 15.0 12.0 - 16.0 g/dL Final   HCT  Date Value Ref Range Status  08/10/2017 45.9 35.0 - 47.0 % Final   MCHC  Date Value Ref Range Status  08/10/2017 32.7 32.0 - 36.0 g/dL Final   MCH  Date Value Ref Range Status  08/10/2017 30.6 26.0 - 34.0 pg Final   MCV  Date Value Ref Range Status  08/10/2017 93.6 80.0 - 100.0 fL Final   No results found for: PLTCOUNTKUC, LABPLAT,  POCPLA RDW  Date Value Ref Range Status  08/10/2017 13.7 11.5 - 14.5 % Final         Passed - Valid encounter within last 6 months    Recent Outpatient Visits           1 month ago Annual physical exam   [facility] Tally Joe T, FNP   3 months ago Acute non-recurrent maxillary sinusitis   [facility] Tally Joe T, FNP   5 months ago Adjustment disorder, unspecified type   [facility] Jerrol Banana., MD   1 year ago [place], Fabio Bering M, [place]   1 year ago DM hyperosmolarity type II, uncontrolled [facility])   [place], Adriana M, PA-C               rosuvastatin (CRESTOR) 20 MG tablet [Pharmacy Med Name: ROSUVASTATIN CALCIUM 20 MG TAB] 90 tablet 1    Sig: TAKE 1 TABLET BY MOUTH EVERY DAY  Cardiovascular:  Antilipid - Statins 2 Failed - 08/30/2021  9:02 AM      Failed - Cr in normal range and within 360 days    Creatinine, Ser  Date Value Ref Range Status  08/23/2017 0.88 0.57 - 1.00 mg/dL Final          Failed - Lipid Panel in normal range within the last 12 months    Cholesterol, Total  Date Value Ref Range Status  08/23/2017 208 (H) 100 - 199 mg/dL Final   LDL Calculated  Date Value Ref Range Status  08/23/2017 88 0 - 99 mg/dL Final   HDL  Date Value Ref Range Status  08/23/2017 96 >39 mg/dL Final   Triglycerides  Date Value Ref Range Status  08/23/2017 121 0 - 149 mg/dL Final         Passed - Patient is not pregnant      Passed - Valid encounter within last 12 months    Recent Outpatient Visits           1 month ago Annual physical exam   [facility] Tally Joe T, FNP   3 months ago Acute non-recurrent maxillary sinusitis   [facility] Tally Joe T, FNP   5 months ago Adjustment disorder, unspecified type   [facility] Jerrol Banana., MD   1 year ago [place], Fabio Bering M, [place]   1 year ago DM hyperosmolarity type II, uncontrolled [facility])   [place], [place], [place]

## 2022-01-14 LAB — PROTEIN / CREATININE RATIO, URINE
Albumin, U: 8
Creatinine, Urine: 5.9

## 2022-01-14 LAB — MICROALBUMIN, URINE
Microalb, Ur: 135.9
Microalb, Ur: 5.9

## 2022-01-14 LAB — TSH: TSH: 3.44 (ref 0.41–5.90)

## 2022-02-28 ENCOUNTER — Other Ambulatory Visit: Payer: Self-pay | Admitting: Family Medicine

## 2022-02-28 DIAGNOSIS — E1100 Type 2 diabetes mellitus with hyperosmolarity without nonketotic hyperglycemic-hyperosmolar coma (NKHHC): Secondary | ICD-10-CM

## 2022-03-18 ENCOUNTER — Other Ambulatory Visit: Payer: Self-pay | Admitting: Family Medicine

## 2022-03-18 DIAGNOSIS — F4320 Adjustment disorder, unspecified: Secondary | ICD-10-CM

## 2022-05-12 LAB — HM DIABETES EYE EXAM

## 2022-05-26 ENCOUNTER — Other Ambulatory Visit: Payer: Self-pay | Admitting: Family Medicine

## 2022-05-26 DIAGNOSIS — Z794 Long term (current) use of insulin: Secondary | ICD-10-CM

## 2022-05-26 DIAGNOSIS — E1100 Type 2 diabetes mellitus with hyperosmolarity without nonketotic hyperglycemic-hyperosmolar coma (NKHHC): Secondary | ICD-10-CM

## 2022-05-26 LAB — HEMOGLOBIN A1C: Hemoglobin A1C: 7.5

## 2022-05-26 NOTE — Telephone Encounter (Signed)
Patient needs OV, will refill medication until OV is made. Patient needs OV for additional refills.  Requested Prescriptions  Pending Prescriptions Disp Refills   metFORMIN (GLUCOPHAGE) 1000 MG tablet [Pharmacy Med Name: METFORMIN HCL 1,000 MG TABLET] 225 tablet 0    Sig: TAKE 1 TABLET IN AM TAKE 1.5 TABLET IN PM. PLEASE SCHEDULE OFFICE VISIT BEFORE ANY FUTURE REFILL.     Endocrinology:  Diabetes - Biguanides Failed - 05/26/2022  1:31 PM      Failed - Cr in normal range and within 360 days    Creatinine, Ser  Date Value Ref Range Status  08/23/2017 0.88 0.57 - 1.00 mg/dL Final         Failed - HBA1C is between 0 and 7.9 and within 180 days    Hemoglobin A1C  Date Value Ref Range Status  04/09/2020 8.4  Final         Failed - eGFR in normal range and within 360 days    GFR calc Af Amer  Date Value Ref Range Status  08/23/2017 83 >59 mL/min/1.73 Final   GFR calc non Af Amer  Date Value Ref Range Status  08/23/2017 72 >59 mL/min/1.73 Final         Failed - B12 Level in normal range and within 720 days    No results found for: "VITAMINB12"       Failed - Valid encounter within last 6 months    Recent Outpatient Visits           9 months ago Annual physical exam   [facility] Tally Joe T, FNP   1 year ago Acute non-recurrent maxillary sinusitis   [facility] Tally Joe T, FNP   1 year ago Adjustment disorder, unspecified type   [facility] Jerrol Banana., MD   1 year ago [place] Collet M, [place]   2 years ago DM hyperosmolarity type II, uncontrolled [facility])   [facility] [place], [place] M, PA-C              Failed - CBC within normal limits and completed in the last 12 months    WBC  Date Value Ref Range Status  08/10/2017 8.4 3.6 - 11.0 K/uL Final   RBC  Date Value Ref Range Status  08/10/2017 4.91 3.80 - 5.20 MIL/uL Final   Hemoglobin  Date Value Ref  Range Status  08/10/2017 15.0 12.0 - 16.0 g/dL Final   HCT  Date Value Ref Range Status  08/10/2017 45.9 35.0 - 47.0 % Final   MCHC  Date Value Ref Range Status  08/10/2017 32.7 32.0 - 36.0 g/dL Final   [facility]  Date Value Ref Range Status  08/10/2017 30.6 26.0 - 34.0 pg Final   MCV  Date Value Ref Range Status  08/10/2017 93.6 80.0 - 100.0 fL Final   No results found for: "PLTCOUNTKUC", "LABPLAT", "POCPLA" RDW  Date Value Ref Range Status  08/10/2017 13.7 11.5 - 14.5 % Final

## 2022-09-02 ENCOUNTER — Other Ambulatory Visit: Payer: Self-pay | Admitting: Family Medicine

## 2022-09-02 DIAGNOSIS — E1100 Type 2 diabetes mellitus with hyperosmolarity without nonketotic hyperglycemic-hyperosmolar coma (NKHHC): Secondary | ICD-10-CM

## 2022-09-14 ENCOUNTER — Ambulatory Visit: Payer: Managed Care, Other (non HMO) | Admitting: Family Medicine

## 2022-09-14 ENCOUNTER — Encounter: Payer: Self-pay | Admitting: Family Medicine

## 2022-09-14 VITALS — BP 124/52 | HR 97 | Ht 64.5 in | Wt 195.0 lb

## 2022-09-14 DIAGNOSIS — E785 Hyperlipidemia, unspecified: Secondary | ICD-10-CM

## 2022-09-14 DIAGNOSIS — F339 Major depressive disorder, recurrent, unspecified: Secondary | ICD-10-CM | POA: Diagnosis not present

## 2022-09-14 DIAGNOSIS — F4320 Adjustment disorder, unspecified: Secondary | ICD-10-CM | POA: Diagnosis not present

## 2022-09-14 DIAGNOSIS — E1042 Type 1 diabetes mellitus with diabetic polyneuropathy: Secondary | ICD-10-CM

## 2022-09-14 DIAGNOSIS — R062 Wheezing: Secondary | ICD-10-CM | POA: Diagnosis not present

## 2022-09-14 DIAGNOSIS — E1069 Type 1 diabetes mellitus with other specified complication: Secondary | ICD-10-CM

## 2022-09-14 MED ORDER — ALBUTEROL SULFATE HFA 108 (90 BASE) MCG/ACT IN AERS: 2.0000 | INHALATION_SPRAY | Freq: Four times a day (QID) | RESPIRATORY_TRACT | 5 refills | Status: DC | PRN

## 2022-09-14 MED ORDER — SERTRALINE HCL 25 MG PO TABS: 25.0000 mg | ORAL_TABLET | Freq: Every day | ORAL | 3 refills | Status: DC

## 2022-09-14 NOTE — Assessment & Plan Note (Signed)
Ongoing since 2022; recommend referral to pulm/cards for further assistance Cannot be proactive vs reactive given her current role at work and being called to an area without notice Continues to use albuterol following episodes to assist Reports similar symptoms would arise at home if she was to bike fast on her stationary bike

## 2022-09-14 NOTE — Assessment & Plan Note (Signed)
Chronic, improving Notes that omnipod is not lasting 3 days d/t insulin needs Plans to f/u with Dr Ladell Pier at [facility] endocrine office Continue to recommend balanced, lower carb meals. Smaller meal size, adding snacks. Choosing water as drink of choice and increasing purposeful exercise.

## 2022-09-14 NOTE — Assessment & Plan Note (Signed)
Chronic, previously stable on review of labs at [facility] LDL goal of <70 recommend diet low in saturated fat and regular exercise - 30 min at least 5 times per week Remains on crestor 20 mg

## 2022-09-14 NOTE — Patient Instructions (Addendum)
Zoloft wean: 50 mg tablets, currently 350 mg/week Recommend 2-4 week taper to assist with side effects Example:  Week 1:  ~262 mg 50 x4 days, 25 x 3 days Week 2: ~175 mg 25 x 7 days Week 3: ~88 mg 12.5 x7 days vs 25 x 3 days, 1 day at 12.5 Can continue to wean further if you have additional medication

## 2022-09-14 NOTE — Progress Notes (Addendum)
Established patient visit   Patient: Lindsey Hendricks   DOB: 11-29-1955   67 y.o. Female  MRN: [id number] Visit Date: 09/14/2022  Today's healthcare provider: Jacky Kindle, FNP  Re Introduced to nurse practitioner role and practice setting.  All questions answered.  Discussed provider/patient relationship and expectations.  Subjective    HPI HPI   Med refill Last edited by Shelly Bombard, CMA on 09/14/2022  1:02 PM.      Medications: Outpatient Medications Prior to Visit  Medication Sig   Continuous Blood Gluc Sensor (DEXCOM G6 SENSOR) MISC Use to monitor blood sugar.  Replace every 10 days   HUMALOG 100 UNIT/ML injection SMARTSIG:0-90 Unit(s) SUB-Q Daily   meclizine (ANTIVERT) 25 MG tablet Take 1 tablet (25 mg total) by mouth as needed for dizziness or nausea.   metFORMIN (GLUCOPHAGE) 1000 MG tablet TAKE 1 TABLET IN AM TAKE 1.5 TABLET IN PM. PLEASE SCHEDULE OFFICE VISIT BEFORE ANY FUTURE REFILL.   [DISCONTINUED] albuterol (VENTOLIN HFA) 108 (90 Base) MCG/ACT inhaler Inhale 2 puffs into the lungs every 6 (six) hours as needed for wheezing or shortness of breath.   [DISCONTINUED] rosuvastatin (CRESTOR) 20 MG tablet TAKE 1 TABLET BY MOUTH EVERY DAY   [DISCONTINUED] sertraline (ZOLOFT) 50 MG tablet TAKE 1 TABLET BY MOUTH EVERY DAY   [DISCONTINUED] fluorouracil (EFUDEX) 5 % cream Apply topically 2 (two) times daily. (Patient not taking: Reported on 09/14/2022)   No facility-administered medications prior to visit.    Review of Systems    Objective    BP (!) 124/52 (BP Location: Right Arm, Patient Position: Sitting, Cuff Size: Large)   Pulse 97   Ht 5' 4.5" (1.638 m)   Wt 195 lb (88.5 kg)   SpO2 97%   BMI 32.95 kg/m   Physical Exam Vitals and nursing note reviewed.  Constitutional:      General: She is not in acute distress.    Appearance: Normal appearance. She is obese. She is not ill-appearing, toxic-appearing or diaphoretic.  HENT:     Head: Normocephalic and atraumatic.   Cardiovascular:     Rate and Rhythm: Normal rate and regular rhythm.     Pulses: Normal pulses.     Heart sounds: Normal heart sounds. No murmur heard.    No friction rub. No gallop.  Pulmonary:     Effort: Pulmonary effort is normal. No respiratory distress.     Breath sounds: Normal breath sounds. No stridor. No wheezing, rhonchi or rales.  Chest:     Chest wall: No tenderness.  Musculoskeletal:        General: No swelling, tenderness, deformity or signs of injury. Normal range of motion.     Right lower leg: No edema.     Left lower leg: No edema.  Skin:    General: Skin is warm and dry.     Capillary Refill: Capillary refill takes less than 2 seconds.     Coloration: Skin is not jaundiced or pale.     Findings: No bruising, erythema, lesion or rash.  Neurological:     General: No focal deficit present.     Mental Status: She is alert and oriented to person, place, and time. Mental status is at baseline.     Cranial Nerves: No cranial nerve deficit.     Sensory: No sensory deficit.     Motor: No weakness.     Coordination: Coordination normal.  Psychiatric:        Mood and Affect:  Mood normal.        Behavior: Behavior normal.        Thought Content: Thought content normal.        Judgment: Judgment normal.     Results for orders placed or performed in visit on 09/14/22  Microalbumin, urine  Result Value Ref Range   Microalb, Ur 5.9   Microalbumin, urine  Result Value Ref Range   Microalb, Ur 135.9   Protein / creatinine ratio, urine  Result Value Ref Range   Creatinine, Urine 5.9    Albumin, U 8   TSH  Result Value Ref Range   TSH 3.44 0.41 - 5.90  Hemoglobin A1c  Result Value Ref Range   Hemoglobin A1C 7.5     Assessment & Plan     Problem List Items Addressed This Visit       Endocrine   Type 1 diabetes mellitus with diabetic polyneuropathy    Chronic, improving Notes that omnipod is not lasting 3 days d/t insulin needs Plans to f/u with Dr  Donald Prose at [facility] endocrine office Continue to recommend balanced, lower carb meals. Smaller meal size, adding snacks. Choosing water as drink of choice and increasing purposeful exercise.       Relevant Medications   sertraline (ZOLOFT) 25 MG tablet   Type 1 diabetes mellitus with hyperlipidemia    Chronic, previously stable on review of labs at [facility] LDL goal of <70 recommend diet low in saturated fat and regular exercise - 30 min at least 5 times per week Remains on crestor 20 mg        Other   Depression, recurrent - Primary    Chronic, stable Pt believes that she can come off medication Discussed appropriate wean and can restart weight neutral medication like prozac or wellbutrin if needed to assist with potential weight gain from zoloft 50 mg      Relevant Medications   sertraline (ZOLOFT) 25 MG tablet   Wheezing    Ongoing since 2022; recommend referral to pulm/cards for further assistance Cannot be proactive vs reactive given her current role at work and being called to an area without notice Continues to use albuterol following episodes to assist Reports similar symptoms would arise at home if she was to bike fast on her stationary bike      Relevant Medications   albuterol (VENTOLIN HFA) 108 (90 Base) MCG/ACT inhaler   Other Relevant Orders   Ambulatory referral to Pulmonology   Ambulatory referral to Cardiology       No data to display             09/14/2022    1:03 PM 03/26/2021    2:55 PM 03/09/2019    9:09 AM  PHQ9 SCORE ONLY  PHQ-9 Total Score 4 6 3     Return if symptoms worsen or fail to improve.     Leilani Merl, FNP, have reviewed all documentation for this visit. The documentation on 10/18/22 for the exam, diagnosis, procedures, and orders are all accurate and complete.  Jacky Kindle, FNP  [facility] [phone] (phone) [phone] (fax)  [facility]

## 2022-09-14 NOTE — Assessment & Plan Note (Deleted)
Chronic, stable Pt believes that she can come off medication Discussed appropriate wean and can restart weight neutral medication like prozac or wellbutrin if needed to assist with potential weight gain from zoloft 50 mg

## 2022-09-21 ENCOUNTER — Other Ambulatory Visit: Payer: Self-pay | Admitting: Family Medicine

## 2022-09-21 DIAGNOSIS — E1100 Type 2 diabetes mellitus with hyperosmolarity without nonketotic hyperglycemic-hyperosmolar coma (NKHHC): Secondary | ICD-10-CM

## 2022-09-22 NOTE — Telephone Encounter (Signed)
Requested Prescriptions  Pending Prescriptions Disp Refills   rosuvastatin (CRESTOR) 20 MG tablet [Pharmacy Med Name: ROSUVASTATIN CALCIUM 20 MG TAB] 90 tablet 0    Sig: TAKE 1 TABLET BY MOUTH EVERY DAY     Cardiovascular:  Antilipid - Statins 2 Failed - 09/21/2022  5:13 PM      Failed - Cr in normal range and within 360 days    Creatinine, Ser  Date Value Ref Range Status  08/23/2017 0.88 0.57 - 1.00 mg/dL Final   Creatinine, Urine  Date Value Ref Range Status  01/14/2022 5.9  Final         Failed - Lipid Panel in normal range within the last 12 months    Cholesterol, Total  Date Value Ref Range Status  08/23/2017 208 (H) 100 - 199 mg/dL Final   LDL Calculated  Date Value Ref Range Status  08/23/2017 88 0 - 99 mg/dL Final   HDL  Date Value Ref Range Status  08/23/2017 96 >39 mg/dL Final   Triglycerides  Date Value Ref Range Status  08/23/2017 121 0 - 149 mg/dL Final         Passed - Patient is not pregnant      Passed - Valid encounter within last 12 months    Recent Outpatient Visits           1 week ago Adjustment disorder, unspecified type   [facility] Gwyneth Sprout, FNP   1 year ago Annual physical exam   [place] Gwyneth Sprout, [place]   1 year ago Acute non-recurrent maxillary sinusitis   [place] Tally Joe T, FNP   1 year ago Adjustment disorder, unspecified type   [facility] Eulas Post, [place]   2 years ago [place], Fabio Bering [place], [place]

## 2022-09-27 ENCOUNTER — Encounter: Payer: Self-pay | Admitting: General Practice

## 2022-10-18 ENCOUNTER — Telehealth: Payer: Self-pay | Admitting: Family Medicine

## 2022-10-18 ENCOUNTER — Other Ambulatory Visit: Payer: Self-pay | Admitting: Family Medicine

## 2022-10-18 DIAGNOSIS — F339 Major depressive disorder, recurrent, unspecified: Secondary | ICD-10-CM | POA: Insufficient documentation

## 2022-10-18 NOTE — Assessment & Plan Note (Signed)
Chronic, stable Pt believes that she can come off medication Discussed appropriate wean and can restart weight neutral medication like prozac or wellbutrin if needed to assist with potential weight gain from zoloft 50 mg 

## 2022-10-18 NOTE — Telephone Encounter (Signed)
Copied from CRM [id number]. Topic: General - Inquiry >> Oct 15, 2022  3:17 PM De Blanch wrote: Reason for CRM: Diego from [employer] is calling in regards to the claim date of service, Sep 16, 2022. Stated pt visit was coded as behavioral health services for diagnosis, which causes a conflict with the member, causing her to pay $60 in co-pay.  Diego stated pt patient denies the visit being regarding behavioral health; it was just to tell PCP she was going off of medication; it was a visit to fill prescriptions. Asked if a coding review could be done by the office.  Please advice.

## 2022-10-18 NOTE — Telephone Encounter (Signed)
Robynn Pane - please review DOS for diagnosis accuracy.    If all looks correct please let me know and I will reach out to [facility].    If something needs to be changed, please addend the note and let me know so I can have billing resubmit the claim.

## 2022-10-21 ENCOUNTER — Telehealth: Payer: Self-pay

## 2022-10-21 LAB — HEMOGLOBIN A1C: Hemoglobin A1C: 8.4

## 2022-10-21 NOTE — Telephone Encounter (Signed)
Copied from CRM [id number]. Topic: General - Inquiry >> Oct 20, 2022  4:32 PM Carrielelia G wrote: Reason for CRM: pt would like an alternative medication, stated PCP, told her it is okay for her to request another if she wanted to try something different other then sertraline (ZOLOFT) 25 MG tablet

## 2022-10-24 ENCOUNTER — Other Ambulatory Visit: Payer: Self-pay | Admitting: Family Medicine

## 2022-10-24 MED ORDER — BUPROPION HCL ER (XL) 150 MG PO TB24: 150.0000 mg | ORAL_TABLET | Freq: Every day | ORAL | 2 refills | Status: DC

## 2022-10-25 NOTE — Telephone Encounter (Signed)
Patient advised. Verbalized understanding. Appt scheduled for 12/24/22 @ 2:00 pm

## 2022-11-01 ENCOUNTER — Encounter: Payer: Self-pay | Admitting: Student in an Organized Health Care Education/Training Program

## 2022-11-01 ENCOUNTER — Ambulatory Visit
Admission: RE | Admit: 2022-11-01 | Discharge: 2022-11-01 | Disposition: A | Discharge status: Home / Self Care | Payer: Managed Care, Other (non HMO) | Source: Ambulatory Visit | Attending: Student in an Organized Health Care Education/Training Program | Admitting: Student in an Organized Health Care Education/Training Program

## 2022-11-01 ENCOUNTER — Ambulatory Visit: Payer: Managed Care, Other (non HMO) | Admitting: Student in an Organized Health Care Education/Training Program

## 2022-11-01 VITALS — BP 128/80 | HR 104 | Temp 98.0°F | Ht 64.5 in | Wt 200.8 lb

## 2022-11-01 DIAGNOSIS — R0602 Shortness of breath: Secondary | ICD-10-CM

## 2022-11-01 LAB — NITRIC OXIDE: Nitric Oxide: 19

## 2022-11-01 NOTE — Progress Notes (Signed)
Synopsis: Referred in for shortness of breath by Jacky Kindle, FNP  Assessment & Plan:   1. SOB (shortness of breath)  Lindsey Hendricks is presenting for the evaluation of exertional dyspnea of 1.5 years in duration. This is made worse with exertion and is associated with cough/wheezing when exerting her self. She has a history of childhood asthma which appears to have not been an issue for her since childhood.  The differential for the symptoms includes asthma/reactive airway disease as well as weight gain/central obesity. I will obtain a pulmonary function test (spirometry, lung volumes, DLCO) to assess for obstruction, air trapping, and loss of ERV seen in central obesity. I will also obtain a chest xray to assess for parenchymal lung disease. Patient's symptoms have been ongoing for over a year and we will hold off on initiating inhalers while we work up the root cause of the dyspnea.  - Nitric oxide at 19 ppb - Pulmonary Function Test [facility] Only; Future - DG Chest 2 View; Future   Return in about 5 weeks (around 12/06/2022).  I spent 60 minutes caring for this patient today, including preparing to see the patient, obtaining a medical history , reviewing a separately obtained history, performing a medically appropriate examination and/or evaluation, counseling and educating the patient/family/caregiver, ordering medications, tests, or procedures, and documenting clinical information in the electronic health record  Raechel Chute, MD [facility] 11/01/2022 9:44 PM    End of visit medications:  No orders of the defined types were placed in this encounter.    Current Outpatient Medications:    albuterol (VENTOLIN HFA) 108 (90 Base) MCG/ACT inhaler, Inhale 2 puffs into the lungs every 6 (six) hours as needed for wheezing or shortness of breath., Disp: 8 g, Rfl: 5   buPROPion (WELLBUTRIN XL) 150 MG 24 hr tablet, Take 1 tablet (150 mg total) by mouth daily., Disp: 30  tablet, Rfl: 2   Continuous Blood Gluc Sensor (DEXCOM G6 SENSOR) MISC, Use to monitor blood sugar.  Replace every 10 days, Disp: , Rfl:    HUMALOG 100 UNIT/ML injection, SMARTSIG:0-90 Unit(s) SUB-Q Daily, Disp: , Rfl:    Insulin Disposable Pump (OMNIPOD 5 G6 PODS, GEN 5,) MISC, Inject into the skin. Inject 1 each subcutaneously every other day, Disp: , Rfl:    meclizine (ANTIVERT) 25 MG tablet, Take 1 tablet (25 mg total) by mouth as needed for dizziness or nausea., Disp: 30 tablet, Rfl: 0   metFORMIN (GLUCOPHAGE) 1000 MG tablet, TAKE 1 TABLET IN AM TAKE 1.5 TABLET IN PM. PLEASE SCHEDULE OFFICE VISIT BEFORE ANY FUTURE REFILL., Disp: 225 tablet, Rfl: 0   rosuvastatin (CRESTOR) 20 MG tablet, TAKE 1 TABLET BY MOUTH EVERY DAY, Disp: 90 tablet, Rfl: 0   sertraline (ZOLOFT) 25 MG tablet, Take 1 tablet (25 mg total) by mouth daily. (Patient not taking: Reported on 11/01/2022), Disp: 30 tablet, Rfl: 3   Subjective:   PATIENT ID: Lindsey Hendricks GENDER: female DOB: [birth year], MRN: [id number]  Chief Complaint  Patient presents with   Consult    Asthma as a child. Covid 2022. SOB with exertion. Wheezing with exertion for a year but it is getting worse. Dry cough.    HPI Lindsey Hendricks is a pleasant 67 year old female presenting to clinic for the evaluation of shortness of breath.  She reports symptoms of exertional dyspnea for around a year and a half that has persisted and increased. She also reports associated wheezing and cough, especially when  she is over exerted. Walking between buildings at work gets her quite winded, and she experiences the cough with said exertion. The cough is non-productive. She does not have any chest pain or chest tightness, denies any hemoptysis, and has not experienced fevers or chills. She reports having had asthma as a child, but outgrew it before she was an adult. She's not had any asthma exacerbations in the past, and has not had to be admitted for respiratory complaints. She  initially used an over the counter inhaler that was not helpful. She was then prescribed albuterol by her PCP which initially helped with symptoms. This has since faded and she feels that her albuterol is barely effective anymore. She reports a weight gain of 20 to 30 lbs over the past year and a half, coinciding with her worsening symptoms.  Patient has a history of type 1 diabetes, depression, and childhood asthma. 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 She denies any occupational exposures. She has lived in an apartment for the past 4 years. She does not have any pets.  Ancillary information including prior medications, full medical/surgical/family/social histories, and PFTs (when available) are listed below and have been reviewed.   Review of Systems  Constitutional:  Negative for chills, fever and weight loss.  Respiratory:  Positive for cough, shortness of breath and wheezing. Negative for hemoptysis and sputum production.   Cardiovascular:  Negative for chest pain and palpitations.  Skin:  Negative for rash.     Objective:   Vitals:   11/01/22 1525  BP: 128/80  Pulse: (!) 104  Temp: 98 F (36.7 C)  SpO2: 97%  Weight: 200 lb 12.8 oz (91.1 kg)  Height: 5' 4.5" (1.638 m)   97% on RA BMI Readings from Last 3 Encounters:  11/01/22 33.93 kg/m  09/14/22 32.95 kg/m  07/31/21 31.98 kg/m   Wt Readings from Last 3 Encounters:  11/01/22 200 lb 12.8 oz (91.1 kg)  09/14/22 195 lb (88.5 kg)  07/31/21 186 lb 4.8 oz (84.5 kg)    Physical Exam Constitutional:      General: She is not in acute distress.    Appearance: She is obese. She is not ill-appearing.  HENT:     Head: Normocephalic.  Cardiovascular:     Rate and Rhythm: Normal rate and regular rhythm.     Pulses: Normal pulses.     Heart sounds: Normal heart sounds.  Pulmonary:     Effort: Pulmonary effort is normal.     Breath sounds: Normal breath sounds. No wheezing or rales.  Abdominal:      Palpations: Abdomen is soft.  Neurological:     General: No focal deficit present.     Mental Status: She is alert and oriented to person, place, and time. Mental status is at baseline.     Ancillary Information    Past Medical History:  Diagnosis Date   Diabetes mellitus without complication    Hyperlipidemia    Meniere disease      Family History  Problem Relation Age of Onset   Lung cancer Father    Healthy Mother    Irregular heart beat Mother    COPD Sister    Congestive Heart Failure Maternal Grandmother    Congestive Heart Failure Maternal Grandfather      Past Surgical History:  Procedure Laterality Date   ABDOMINAL HYSTERECTOMY     ANKLE FRACTURE SURGERY Right    BREAST EXCISIONAL BIOPSY Right    @  1988   CHOLECYSTECTOMY     TONSILLECTOMY     WRIST ARTHROSCOPY Left    Cyst     Social History   Socioeconomic History   Marital status: Married    Spouse name: Not on file   Number of children: Not on file   Years of education: Not on file   Highest education level: Not on file  Occupational History   Not on file  Tobacco Use   Smoking status: Never   Smokeless tobacco: Never  Vaping Use   Vaping Use: Never used  Substance and Sexual Activity   Alcohol use: No   Drug use: No   Sexual activity: Not on file  Other Topics Concern   Not on file  Social History Narrative   Not on file   Social Determinants of Health   Financial Resource Strain: Not on file  Food Insecurity: Not on file  Transportation Needs: Not on file  Physical Activity: Not on file  Stress: Not on file  Social Connections: Not on file  Intimate Partner Violence: Not on file     No Known Allergies   CBC    Component Value Date/Time   WBC 8.4 08/10/2017 0417   RBC 4.91 08/10/2017 0417   HGB 15.0 08/10/2017 0417   HCT 45.9 08/10/2017 0417   PLT 288 08/10/2017 0417   MCV 93.6 08/10/2017 0417   MCH 30.6 08/10/2017 0417   MCHC 32.7 08/10/2017 0417   RDW 13.7 08/10/2017  0417   LYMPHSABS 1.8 08/10/2017 0417   MONOABS 0.5 08/10/2017 0417   EOSABS 0.1 08/10/2017 0417   BASOSABS 0.1 08/10/2017 0417    Pulmonary Functions Testing Results:     No data to display          Outpatient Medications Prior to Visit  Medication Sig Dispense Refill   albuterol (VENTOLIN HFA) 108 (90 Base) MCG/ACT inhaler Inhale 2 puffs into the lungs every 6 (six) hours as needed for wheezing or shortness of breath. 8 g 5   buPROPion (WELLBUTRIN XL) 150 MG 24 hr tablet Take 1 tablet (150 mg total) by mouth daily. 30 tablet 2   Continuous Blood Gluc Sensor (DEXCOM G6 SENSOR) MISC Use to monitor blood sugar.  Replace every 10 days     HUMALOG 100 UNIT/ML injection SMARTSIG:0-90 Unit(s) SUB-Q Daily     Insulin Disposable Pump (OMNIPOD 5 G6 PODS, GEN 5,) MISC Inject into the skin. Inject 1 each subcutaneously every other day     meclizine (ANTIVERT) 25 MG tablet Take 1 tablet (25 mg total) by mouth as needed for dizziness or nausea. 30 tablet 0   metFORMIN (GLUCOPHAGE) 1000 MG tablet TAKE 1 TABLET IN AM TAKE 1.5 TABLET IN PM. PLEASE SCHEDULE OFFICE VISIT BEFORE ANY FUTURE REFILL. 225 tablet 0   rosuvastatin (CRESTOR) 20 MG tablet TAKE 1 TABLET BY MOUTH EVERY DAY 90 tablet 0   sertraline (ZOLOFT) 25 MG tablet Take 1 tablet (25 mg total) by mouth daily. (Patient not taking: Reported on 11/01/2022) 30 tablet 3   No facility-administered medications prior to visit.

## 2022-11-07 NOTE — Addendum Note (Signed)
Addended byRaechel Chute on: 11/07/2022 06:26 PM   Modules accepted: Orders

## 2022-11-08 ENCOUNTER — Telehealth: Payer: Self-pay | Admitting: Pulmonary Disease

## 2022-11-08 NOTE — Telephone Encounter (Signed)
Pt called in saying she missed a call back from [place]

## 2022-11-08 NOTE — Telephone Encounter (Signed)
I have spoke with Mrs. Lindsey Hendricks and had her scheduled to get her CT done 11/12/22 but her insurance wouldn't approve [facility].  I have left her another message stating we needed to change her CT appt to [facility] . Right now the CT has been scheduled for 11/19/22 @ 12:30pm since the patient wanted a Friday

## 2022-11-09 NOTE — Telephone Encounter (Signed)
I have spoke with Lindsey Hendricks again and she is aware of her CT appt and location change per her insurance.  She was wondering why. I tried to explain that some insurance plans think all location that are connected to the hospital will have the same facilities charge and they want the patient to use a free standing location

## 2022-11-12 ENCOUNTER — Other Ambulatory Visit: Payer: Managed Care, Other (non HMO)

## 2022-11-15 ENCOUNTER — Other Ambulatory Visit: Payer: Self-pay | Admitting: Family Medicine

## 2022-11-16 NOTE — Telephone Encounter (Signed)
Requested medication (s) are due for refill today: no  Requested medication (s) are on the active medication list: yes  Last refill:  10/24/22 #30 2 RF  Future visit scheduled: yes  Notes to clinic:  when ordered, pt to f/u in 2 months - is 90 day refill ok or no change since needs f/u   Requested Prescriptions  Pending Prescriptions Disp Refills   buPROPion (WELLBUTRIN XL) 150 MG 24 hr tablet [Pharmacy Med Name: BUPROPION HCL XL 150 MG TABLET] 90 tablet 1    Sig: TAKE 1 TABLET BY MOUTH EVERY DAY     Psychiatry: Antidepressants - bupropion Failed - 11/15/2022  1:31 PM      Failed - Cr in normal range and within 360 days    Creatinine, Ser  Date Value Ref Range Status  08/23/2017 0.88 0.57 - 1.00 mg/dL Final   Creatinine, Urine  Date Value Ref Range Status  01/14/2022 5.9  Final         Failed - AST in normal range and within 360 days    AST  Date Value Ref Range Status  08/23/2017 22 0 - 40 IU/L Final         Failed - ALT in normal range and within 360 days    ALT  Date Value Ref Range Status  08/23/2017 20 0 - 32 IU/L Final         Passed - Completed PHQ-2 or PHQ-9 in the last 360 days      Passed - Last BP in normal range    BP Readings from Last 1 Encounters:  11/01/22 128/80         Passed - Valid encounter within last 6 months    Recent Outpatient Visits           2 months ago Depression, recurrent (HCC)   [place] [facility] Jacky Kindle, FNP   1 year ago Annual physical exam   [place] [facility] Jacky Kindle, FNP   1 year ago Acute non-recurrent maxillary sinusitis   [place] [facility] Merita Norton T, FNP   1 year ago Adjustment disorder, unspecified type   [place] Bosie Clos, [place]   2 years ago Dysuria   [facility] Trey Sailors, [place]       Future Appointments             In 3 weeks Raechel Chute, MD [facility] [place]   In 1 month Jacky Kindle, FNP [facility]

## 2022-11-19 ENCOUNTER — Ambulatory Visit
Admission: RE | Admit: 2022-11-19 | Discharge: 2022-11-19 | Disposition: A | Discharge status: Home / Self Care | Payer: Managed Care, Other (non HMO) | Source: Ambulatory Visit | Attending: Student in an Organized Health Care Education/Training Program

## 2022-11-19 DIAGNOSIS — R0602 Shortness of breath: Secondary | ICD-10-CM

## 2022-12-02 ENCOUNTER — Ambulatory Visit: Payer: Managed Care, Other (non HMO) | Attending: Student in an Organized Health Care Education/Training Program

## 2022-12-02 DIAGNOSIS — Z8709 Personal history of other diseases of the respiratory system: Secondary | ICD-10-CM | POA: Diagnosis not present

## 2022-12-02 DIAGNOSIS — R0602 Shortness of breath: Secondary | ICD-10-CM | POA: Diagnosis present

## 2022-12-02 LAB — PULMONARY FUNCTION TEST ARMC ONLY
DL/VA % pred: 106 %
DL/VA: 4.46 ml/min/mmHg/L
DLCO unc % pred: 85 %
DLCO unc: 17.11 ml/min/mmHg
FEF 25-75 Post: 2.31 L/sec
FEF 25-75 Pre: 1.9 L/sec
FEF2575-%Change-Post: 21 %
FEF2575-%Pred-Post: 106 %
FEF2575-%Pred-Pre: 87 %
FEV1-%Change-Post: 7 %
FEV1-%Pred-Post: 74 %
FEV1-%Pred-Pre: 68 %
FEV1-Post: 1.81 L
FEV1-Pre: 1.69 L
FEV1FVC-%Change-Post: 0 %
FEV1FVC-%Pred-Pre: 106 %
FEV6-%Change-Post: 6 %
FEV6-%Pred-Post: 71 %
FEV6-%Pred-Pre: 67 %
FEV6-Post: 2.2 L
FEV6-Pre: 2.06 L
FEV6FVC-%Pred-Post: 104 %
FEV6FVC-%Pred-Pre: 104 %
FVC-%Change-Post: 6 %
FVC-%Pred-Post: 68 %
FVC-%Pred-Pre: 64 %
FVC-Post: 2.2 L
FVC-Pre: 2.06 L
Post FEV1/FVC ratio: 82 %
Post FEV6/FVC ratio: 100 %
Pre FEV1/FVC ratio: 82 %
Pre FEV6/FVC Ratio: 100 %
RV % pred: 88 %
RV: 1.83 L
TLC % pred: 85 %
TLC: 4.34 L

## 2022-12-02 MED ORDER — ALBUTEROL SULFATE (2.5 MG/3ML) 0.083% IN NEBU
2.5000 mg | INHALATION_SOLUTION | Freq: Once | RESPIRATORY_TRACT | Status: AC
  Administered 2022-12-02: 2.5 mg via RESPIRATORY_TRACT
  Filled 2022-12-02: qty 3

## 2022-12-12 ENCOUNTER — Other Ambulatory Visit: Payer: Self-pay | Admitting: Family Medicine

## 2022-12-13 ENCOUNTER — Ambulatory Visit: Payer: Managed Care, Other (non HMO) | Admitting: Student in an Organized Health Care Education/Training Program

## 2022-12-13 ENCOUNTER — Encounter: Payer: Self-pay | Admitting: Student in an Organized Health Care Education/Training Program

## 2022-12-13 VITALS — BP 138/82 | HR 104 | Temp 97.9°F | Ht 64.5 in | Wt 202.6 lb

## 2022-12-13 DIAGNOSIS — R0602 Shortness of breath: Secondary | ICD-10-CM | POA: Diagnosis not present

## 2022-12-13 MED ORDER — TRELEGY ELLIPTA 100-62.5-25 MCG/ACT IN AEPB: 1.0000 | INHALATION_SPRAY | Freq: Every day | RESPIRATORY_TRACT | 0 refills | Status: DC

## 2022-12-13 NOTE — Telephone Encounter (Signed)
Last RF 10/24/22 #30 2 RF   Requested Prescriptions  Refused Prescriptions Disp Refills   buPROPion (WELLBUTRIN XL) 150 MG 24 hr tablet [Pharmacy Med Name: BUPROPION HCL XL 150 MG TABLET] 90 tablet 1    Sig: TAKE 1 TABLET BY MOUTH EVERY DAY     Psychiatry: Antidepressants - bupropion Failed - 12/12/2022  1:31 PM      Failed - Cr in normal range and within 360 days    Creatinine, Ser  Date Value Ref Range Status  08/23/2017 0.88 0.57 - 1.00 mg/dL Final   Creatinine, Urine  Date Value Ref Range Status  01/14/2022 5.9  Final         Failed - AST in normal range and within 360 days    AST  Date Value Ref Range Status  08/23/2017 22 0 - 40 IU/L Final         Failed - ALT in normal range and within 360 days    ALT  Date Value Ref Range Status  08/23/2017 20 0 - 32 IU/L Final         Passed - Completed PHQ-2 or PHQ-9 in the last 360 days      Passed - Last BP in normal range    BP Readings from Last 1 Encounters:  12/13/22 138/82         Passed - Valid encounter within last 6 months    Recent Outpatient Visits           3 months ago Depression, recurrent [facility])   [place] [facility] Jacky Kindle, FNP   1 year ago Annual physical exam   [place] [facility] Jacky Kindle, FNP   1 year ago Acute non-recurrent maxillary sinusitis   [place] [facility] Merita Norton T, FNP   1 year ago Adjustment disorder, unspecified type   [facility] Bosie Clos, [place]   2 years ago Dysuria   [facility] Trey Sailors, [place]       Future Appointments             In 1 week Jacky Kindle, FNP [facility], [facility]

## 2022-12-13 NOTE — Progress Notes (Signed)
Synopsis: Referred in for shortness of breath by Jacky Kindle, FNP  Assessment & Plan:   #Shortness of Breath  She is presenting for follow up on shortness of breath in the setting of history of childhood asthma. Symptoms have persisted and she's been unable to loose weight since our last visit. PFT's show mild obstructive lung disease, and given this, will attempt a trial of long acting bronchodilators. Will give her a sample of Trelegy, and should this help with her symptoms, will send in a prescription.  Furthermore, we discussed dietary choices to help with weight loss. She does have diabetes and is on an insulin pump, making weight loss difficult. PFT's show a significant drop in ERV, which is contributing to her symptoms. This should improve with weight loss.   The differential for the symptoms includes asthma/reactive airway disease as well as weight gain/central obesity.   -Fluticasone-Umeclidin-Vilant (TRELEGY ELLIPTA) 100-62.5-25 MCG/ACT AEPB one puff once daily  Return in about 1 year (around 12/13/2023).  I spent 30 minutes caring for this patient today, including preparing to see the patient, obtaining a medical history , reviewing a separately obtained history, performing a medically appropriate examination and/or evaluation, counseling and educating the patient/family/caregiver, ordering medications, tests, or procedures, and documenting clinical information in the electronic health record  Raechel Chute, MD [facility] 12/13/2022 3:41 PM    End of visit medications:  Meds ordered this encounter  Medications   Fluticasone-Umeclidin-Vilant (TRELEGY ELLIPTA) 100-62.5-25 MCG/ACT AEPB    Sig: Inhale 1 puff into the lungs daily.    Dispense:  14 each    Refill:  0    Order Specific Question:   Lot Number?    Answer:   [id number]    Order Specific Question:   Expiration Date?    Answer:   02/10/2024    Order Specific Question:   Manufacturer?    Answer:    [employer] [12]    Order Specific Question:   Quantity    Answer:   1     Current Outpatient Medications:    albuterol (VENTOLIN HFA) 108 (90 Base) MCG/ACT inhaler, Inhale 2 puffs into the lungs every 6 (six) hours as needed for wheezing or shortness of breath., Disp: 8 g, Rfl: 5   buPROPion (WELLBUTRIN XL) 150 MG 24 hr tablet, Take 1 tablet (150 mg total) by mouth daily., Disp: 30 tablet, Rfl: 2   Continuous Blood Gluc Sensor (DEXCOM G6 SENSOR) MISC, Use to monitor blood sugar.  Replace every 10 days, Disp: , Rfl:    Fluticasone-Umeclidin-Vilant (TRELEGY ELLIPTA) 100-62.5-25 MCG/ACT AEPB, Inhale 1 puff into the lungs daily., Disp: 14 each, Rfl: 0   HUMALOG 100 UNIT/ML injection, SMARTSIG:0-90 Unit(s) SUB-Q Daily, Disp: , Rfl:    Insulin Disposable Pump (OMNIPOD 5 G6 PODS, GEN 5,) MISC, Inject into the skin. Inject 1 each subcutaneously every other day, Disp: , Rfl:    meclizine (ANTIVERT) 25 MG tablet, Take 1 tablet (25 mg total) by mouth as needed for dizziness or nausea., Disp: 30 tablet, Rfl: 0   metFORMIN (GLUCOPHAGE) 1000 MG tablet, TAKE 1 TABLET IN AM TAKE 1.5 TABLET IN PM. PLEASE SCHEDULE OFFICE VISIT BEFORE ANY FUTURE REFILL., Disp: 225 tablet, Rfl: 0   rosuvastatin (CRESTOR) 20 MG tablet, TAKE 1 TABLET BY MOUTH EVERY DAY, Disp: 90 tablet, Rfl: 0   sertraline (ZOLOFT) 25 MG tablet, Take 1 tablet (25 mg total) by mouth daily. (Patient not taking: Reported on 12/13/2022), Disp: 30 tablet, Rfl:  3   Subjective:   PATIENT ID: Rada Hay GENDER: female DOB: 12-29-1955, MRN: [id number]  Chief Complaint  Patient presents with   Follow-up    SOB with exertion, dry cough and wheezing.      HPI  Ms. Mccalmont is a pleasant 67 year old female presenting to clinic for the evaluation of shortness of breath.   She reports symptoms of exertional dyspnea for around a year and a half that has persisted and increased. She also reports associated wheezing and cough, especially when she is  over exerted. Walking between buildings at work gets her quite winded, and she experiences the cough with said exertion. The cough is non-productive. She does not have any chest pain or chest tightness, denies any hemoptysis, and has not experienced fevers or chills. She reports having had asthma as a child, but outgrew it before she was an adult. She's not had any asthma exacerbations in the past, and has not had to be admitted for respiratory complaints. She initially used an over the counter inhaler that was not helpful. She was then prescribed albuterol by her PCP which initially helped with symptoms. She reports a weight gain of 20 to 30 lbs over the past year and a half, coinciding with her worsening symptoms.  Today, she reports symptom persistence. She's been unable to loose weight secondary to shortness of breath limiting her ability to exercise. She had PFT's and is here to discuss results. She did feel somewhat improved when given albuterol during her pulmonary function testing.   Patient has a history of type 1 diabetes, depression, and childhood asthma. 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 She denies any occupational exposures. She has lived in an apartment for the past 4 years. She does not have any pets.  Ancillary information including prior medications, full medical/surgical/family/social histories, and PFTs (when available) are listed below and have been reviewed.   Review of Systems  Constitutional:  Negative for chills, fever and weight loss.  Respiratory:  Positive for cough and shortness of breath. Negative for hemoptysis, sputum production and wheezing.   Cardiovascular:  Negative for chest pain and palpitations.  Skin:  Negative for rash.     Objective:   Vitals:   12/13/22 1459  BP: 138/82  Pulse: (!) 104  Temp: 97.9 F (36.6 C)  TempSrc: Temporal  SpO2: 95%  Weight: 202 lb 9.6 oz (91.9 kg)  Height: 5' 4.5" (1.638 m)   95% on RA BMI Readings  from Last 3 Encounters:  12/13/22 34.24 kg/m  11/01/22 33.93 kg/m  09/14/22 32.95 kg/m   Wt Readings from Last 3 Encounters:  12/13/22 202 lb 9.6 oz (91.9 kg)  11/01/22 200 lb 12.8 oz (91.1 kg)  09/14/22 195 lb (88.5 kg)    Physical Exam Constitutional:      General: She is not in acute distress.    Appearance: She is obese. She is not ill-appearing.  HENT:     Head: Normocephalic.  Cardiovascular:     Rate and Rhythm: Normal rate and regular rhythm.     Pulses: Normal pulses.     Heart sounds: Normal heart sounds.  Pulmonary:     Effort: Pulmonary effort is normal.     Breath sounds: Normal breath sounds. No wheezing or rales.  Abdominal:     Palpations: Abdomen is soft.  Neurological:     General: No focal deficit present.     Mental Status: She is alert and  oriented to person, place, and time. Mental status is at baseline.       Ancillary Information    Past Medical History:  Diagnosis Date   Diabetes mellitus without complication (HCC)    Hyperlipidemia    Meniere disease      Family History  Problem Relation Age of Onset   Lung cancer Father    Healthy Mother    Irregular heart beat Mother    COPD Sister    Congestive Heart Failure Maternal Grandmother    Congestive Heart Failure Maternal Grandfather      Past Surgical History:  Procedure Laterality Date   ABDOMINAL HYSTERECTOMY     ANKLE FRACTURE SURGERY Right    BREAST EXCISIONAL BIOPSY Right    @1988    CHOLECYSTECTOMY     TONSILLECTOMY     WRIST ARTHROSCOPY Left    Cyst     Social History   Socioeconomic History   Marital status: Married    Spouse name: Not on file   Number of children: Not on file   Years of education: Not on file   Highest education level: Not on file  Occupational History   Not on file  Tobacco Use   Smoking status: Never   Smokeless tobacco: Never  Vaping Use   Vaping Use: Never used  Substance and Sexual Activity   Alcohol use: No   Drug use: No    Sexual activity: Not on file  Other Topics Concern   Not on file  Social History Narrative   Not on file   Social Determinants of Health   Financial Resource Strain: Not on file  Food Insecurity: Not on file  Transportation Needs: Not on file  Physical Activity: Not on file  Stress: Not on file  Social Connections: Not on file  Intimate Partner Violence: Not on file     No Known Allergies   CBC    Component Value Date/Time   WBC 8.4 08/10/2017 0417   RBC 4.91 08/10/2017 0417   HGB 15.0 08/10/2017 0417   HCT 45.9 08/10/2017 0417   PLT 288 08/10/2017 0417   MCV 93.6 08/10/2017 0417   MCH 30.6 08/10/2017 0417   MCHC 32.7 08/10/2017 0417   RDW 13.7 08/10/2017 0417   LYMPHSABS 1.8 08/10/2017 0417   MONOABS 0.5 08/10/2017 0417   EOSABS 0.1 08/10/2017 0417   BASOSABS 0.1 08/10/2017 0417    Pulmonary Functions Testing Results:    Latest Ref Rng & Units 12/02/2022    4:20 PM  PFT Results  FVC-Pre L 2.06   FVC-Predicted Pre % 64   FVC-Post L 2.20   FVC-Predicted Post % 68   Pre FEV1/FVC % % 82   Post FEV1/FCV % % 82   FEV1-Pre L 1.69   FEV1-Predicted Pre % 68   FEV1-Post L 1.81   DLCO uncorrected ml/min/mmHg 17.11   DLCO UNC% % 85   DLVA Predicted % 106   TLC L 4.34   TLC % Predicted % 85   RV % Predicted % 88     Outpatient Medications Prior to Visit  Medication Sig Dispense Refill   albuterol (VENTOLIN HFA) 108 (90 Base) MCG/ACT inhaler Inhale 2 puffs into the lungs every 6 (six) hours as needed for wheezing or shortness of breath. 8 g 5   buPROPion (WELLBUTRIN XL) 150 MG 24 hr tablet Take 1 tablet (150 mg total) by mouth daily. 30 tablet 2   Continuous Blood Gluc Sensor (DEXCOM G6 SENSOR) MISC  Use to monitor blood sugar.  Replace every 10 days     HUMALOG 100 UNIT/ML injection SMARTSIG:0-90 Unit(s) SUB-Q Daily     Insulin Disposable Pump (OMNIPOD 5 G6 PODS, GEN 5,) MISC Inject into the skin. Inject 1 each subcutaneously every other day     meclizine  (ANTIVERT) 25 MG tablet Take 1 tablet (25 mg total) by mouth as needed for dizziness or nausea. 30 tablet 0   metFORMIN (GLUCOPHAGE) 1000 MG tablet TAKE 1 TABLET IN AM TAKE 1.5 TABLET IN PM. PLEASE SCHEDULE OFFICE VISIT BEFORE ANY FUTURE REFILL. 225 tablet 0   rosuvastatin (CRESTOR) 20 MG tablet TAKE 1 TABLET BY MOUTH EVERY DAY 90 tablet 0   sertraline (ZOLOFT) 25 MG tablet Take 1 tablet (25 mg total) by mouth daily. (Patient not taking: Reported on 12/13/2022) 30 tablet 3   No facility-administered medications prior to visit.

## 2022-12-20 ENCOUNTER — Encounter: Payer: Self-pay | Admitting: Student in an Organized Health Care Education/Training Program

## 2022-12-21 MED ORDER — TRELEGY ELLIPTA 100-62.5-25 MCG/ACT IN AEPB: 1.0000 | INHALATION_SPRAY | Freq: Every day | RESPIRATORY_TRACT | 2 refills | Status: DC

## 2022-12-21 NOTE — Telephone Encounter (Signed)
Just and FYI

## 2022-12-24 ENCOUNTER — Ambulatory Visit: Payer: Managed Care, Other (non HMO) | Admitting: Family Medicine

## 2022-12-24 ENCOUNTER — Encounter: Payer: Self-pay | Admitting: *Deleted

## 2022-12-24 ENCOUNTER — Encounter: Payer: Self-pay | Admitting: Family Medicine

## 2022-12-24 VITALS — BP 146/89 | HR 108 | Ht 64.5 in | Wt 199.0 lb

## 2022-12-24 DIAGNOSIS — F4321 Adjustment disorder with depressed mood: Secondary | ICD-10-CM | POA: Insufficient documentation

## 2022-12-24 DIAGNOSIS — E1059 Type 1 diabetes mellitus with other circulatory complications: Secondary | ICD-10-CM | POA: Diagnosis not present

## 2022-12-24 DIAGNOSIS — F339 Major depressive disorder, recurrent, unspecified: Secondary | ICD-10-CM | POA: Diagnosis not present

## 2022-12-24 DIAGNOSIS — E1069 Type 1 diabetes mellitus with other specified complication: Secondary | ICD-10-CM | POA: Diagnosis not present

## 2022-12-24 DIAGNOSIS — E785 Hyperlipidemia, unspecified: Secondary | ICD-10-CM

## 2022-12-24 DIAGNOSIS — I152 Hypertension secondary to endocrine disorders: Secondary | ICD-10-CM

## 2022-12-24 MED ORDER — ROSUVASTATIN CALCIUM 20 MG PO TABS
20.0000 mg | ORAL_TABLET | Freq: Every day | ORAL | 3 refills | Status: AC
Start: 2022-12-24 — End: ?

## 2022-12-24 MED ORDER — BUPROPION HCL ER (XL) 300 MG PO TB24
300.0000 mg | ORAL_TABLET | Freq: Every day | ORAL | 3 refills | Status: AC
Start: 2022-12-24 — End: ?

## 2022-12-24 NOTE — Assessment & Plan Note (Signed)
Chronic, improved; however, not at goal Increase wellbutrin from 150 to 300 mg to assist  Follow up in 6-8 weeks or as needed

## 2022-12-24 NOTE — Assessment & Plan Note (Signed)
Chronic, improved but not at goal Wishes to continue wellbutrin alone; will uptitrate from 150-300 mg Encourage follow up in 2 months or as needed Denies SI or HI PHQ 9 positive; pt believes it is a complicated grief reaction depression diagnosis

## 2022-12-24 NOTE — Assessment & Plan Note (Signed)
Chronic, previously on crestor 20 mg Request for refills recommend diet low in saturated fat and regular exercise - 30 min at least 5 times per week LDL goal of 55-70

## 2022-12-24 NOTE — Progress Notes (Signed)
Established patient visit  Patient: Lindsey Hendricks   DOB: 12/11/1955   67 y.o. Female  MRN: [id number] Visit Date: 12/24/2022  Today's healthcare provider: Jacky Kindle, FNP  Re Introduced to nurse practitioner role and practice setting.  All questions answered.  Discussed provider/patient relationship and expectations.  Chief Complaint  Patient presents with   Follow-up   Subjective    HPI   Chronic grief since passing of her husband  Medications: Outpatient Medications Prior to Visit  Medication Sig   Continuous Blood Gluc Sensor (DEXCOM G6 SENSOR) MISC Use to monitor blood sugar.  Replace every 10 days   Fluticasone-Umeclidin-Vilant (TRELEGY ELLIPTA) 100-62.5-25 MCG/ACT AEPB Inhale 1 puff into the lungs daily.   HUMALOG 100 UNIT/ML injection SMARTSIG:0-90 Unit(s) SUB-Q Daily   Insulin Disposable Pump (OMNIPOD 5 G6 PODS, GEN 5,) MISC Inject into the skin. Inject 1 each subcutaneously every other day   [DISCONTINUED] albuterol (VENTOLIN HFA) 108 (90 Base) MCG/ACT inhaler Inhale 2 puffs into the lungs every 6 (six) hours as needed for wheezing or shortness of breath.   [DISCONTINUED] buPROPion (WELLBUTRIN XL) 150 MG 24 hr tablet Take 1 tablet (150 mg total) by mouth daily.   [DISCONTINUED] meclizine (ANTIVERT) 25 MG tablet Take 1 tablet (25 mg total) by mouth as needed for dizziness or nausea.   [DISCONTINUED] rosuvastatin (CRESTOR) 20 MG tablet TAKE 1 TABLET BY MOUTH EVERY DAY   [DISCONTINUED] metFORMIN (GLUCOPHAGE) 1000 MG tablet TAKE 1 TABLET IN AM TAKE 1.5 TABLET IN PM. PLEASE SCHEDULE OFFICE VISIT BEFORE ANY FUTURE REFILL.   [DISCONTINUED] sertraline (ZOLOFT) 25 MG tablet Take 1 tablet (25 mg total) by mouth daily. (Patient not taking: Reported on 12/13/2022)   No facility-administered medications prior to visit.    Review of Systems    Objective    BP (!) 146/89   Pulse (!) 108   Ht 5' 4.5" (1.638 m)   Wt 199 lb (90.3 kg)   SpO2 97%   BMI 33.63 kg/m   Physical  Exam Vitals and nursing note reviewed.  Constitutional:      General: She is not in acute distress.    Appearance: Normal appearance. She is obese. She is not ill-appearing, toxic-appearing or diaphoretic.  HENT:     Head: Normocephalic and atraumatic.  Cardiovascular:     Rate and Rhythm: Normal rate and regular rhythm.     Pulses: Normal pulses.     Heart sounds: Normal heart sounds. No murmur heard.    No friction rub. No gallop.  Pulmonary:     Effort: Pulmonary effort is normal. No respiratory distress.     Breath sounds: Normal breath sounds. No stridor. No wheezing, rhonchi or rales.  Chest:     Chest wall: No tenderness.  Musculoskeletal:        General: No swelling, tenderness, deformity or signs of injury. Normal range of motion.     Right lower leg: No edema.     Left lower leg: No edema.  Skin:    General: Skin is warm and dry.     Capillary Refill: Capillary refill takes less than 2 seconds.     Coloration: Skin is not jaundiced or pale.     Findings: No bruising, erythema, lesion or rash.  Neurological:     General: No focal deficit present.     Mental Status: She is alert and oriented to person, place, and time. Mental status is at baseline.     Cranial Nerves: No  cranial nerve deficit.     Sensory: No sensory deficit.     Motor: No weakness.     Coordination: Coordination normal.  Psychiatric:        Mood and Affect: Mood is depressed. Affect is tearful.        Behavior: Behavior normal.        Thought Content: Thought content normal. Thought content does not include homicidal or suicidal ideation. Thought content does not include homicidal or suicidal plan.        Judgment: Judgment normal.     Results for orders placed or performed in visit on 12/24/22  Hemoglobin A1c  Result Value Ref Range   Hemoglobin A1C 8.4     Assessment & Plan     Problem List Items Addressed This Visit       Cardiovascular and Mediastinum   Hypertension associated with  type 1 diabetes mellitus (HCC)    Chronic, elevated Previously well controlled Not on medication at this time; encourage start of medication and discussion with endocrinologist  Goal 129/79      Relevant Medications   rosuvastatin (CRESTOR) 20 MG tablet     Endocrine   Type 1 diabetes mellitus with hyperlipidemia (HCC)    Chronic, previously on crestor 20 mg Request for refills recommend diet low in saturated fat and regular exercise - 30 min at least 5 times per week LDL goal of 55-70      Relevant Medications   rosuvastatin (CRESTOR) 20 MG tablet     Other   Complicated grief - Primary    Chronic, improved; however, not at goal Increase wellbutrin from 150 to 300 mg to assist  Follow up in 6-8 weeks or as needed       Relevant Medications   buPROPion (WELLBUTRIN XL) 300 MG 24 hr tablet   Depression, recurrent (HCC)    Chronic, improved but not at goal Wishes to continue wellbutrin alone; will uptitrate from 150-300 mg Encourage follow up in 2 months or as needed Denies SI or HI PHQ 9 positive; pt believes it is a complicated grief reaction depression diagnosis      Relevant Medications   buPROPion (WELLBUTRIN XL) 300 MG 24 hr tablet   Return in about 6 weeks (around 02/04/2023) for anxiety and depression.     Leilani Merl, FNP, have reviewed all documentation for this visit. The documentation on 12/24/22 for the exam, diagnosis, procedures, and orders are all accurate and complete.  Jacky Kindle, FNP  [facility] [phone] (phone) [phone] (fax)  [facility]

## 2022-12-24 NOTE — Assessment & Plan Note (Signed)
Chronic, elevated Previously well controlled Not on medication at this time; encourage start of medication and discussion with endocrinologist  Goal 129/79

## 2023-03-16 ENCOUNTER — Other Ambulatory Visit: Payer: Self-pay | Admitting: Student in an Organized Health Care Education/Training Program

## 2023-05-14 ENCOUNTER — Other Ambulatory Visit: Payer: Self-pay | Admitting: Family Medicine

## 2023-05-14 DIAGNOSIS — R062 Wheezing: Secondary | ICD-10-CM

## 2023-05-16 NOTE — Telephone Encounter (Signed)
Unable to refill per protocol, Rx expired. Discontinued 12/24/22.  Requested Prescriptions  Pending Prescriptions Disp Refills   albuterol (VENTOLIN HFA) 108 (90 Base) MCG/ACT inhaler [Pharmacy Med Name: ALBUTEROL HFA (PROAIR) INHALER] 8.5 each 5    Sig: TAKE 2 PUFFS BY MOUTH EVERY 6 HOURS AS NEEDED FOR WHEEZE OR SHORTNESS OF BREATH     Pulmonology:  Beta Agonists 2 Failed - 05/14/2023  8:31 AM      Failed - Last BP in normal range    BP Readings from Last 1 Encounters:  12/24/22 (!) 146/89         Passed - Last Heart Rate in normal range    Pulse Readings from Last 1 Encounters:  12/24/22 (!) 108         Passed - Valid encounter within last 12 months    Recent Outpatient Visits           4 months ago Complicated grief   [facility] Merita Norton T, FNP   8 months ago Depression, recurrent [facility])   [facility] Jacky Kindle, FNP   1 year ago Annual physical exam   [place] [facility] Jacky Kindle, FNP   1 year ago Acute non-recurrent maxillary sinusitis    [facility] Merita Norton T, FNP   2 years ago Adjustment disorder, unspecified type   [facility] Bosie Clos, MD

## 2023-05-25 LAB — HM DIABETES EYE EXAM

## 2023-06-02 ENCOUNTER — Encounter: Payer: Self-pay | Admitting: Ophthalmology

## 2023-06-08 NOTE — Discharge Instructions (Signed)

## 2023-06-12 ENCOUNTER — Other Ambulatory Visit: Payer: Self-pay | Admitting: Student in an Organized Health Care Education/Training Program

## 2023-06-14 ENCOUNTER — Encounter: Payer: Self-pay | Admitting: Ophthalmology

## 2023-06-14 ENCOUNTER — Other Ambulatory Visit: Payer: Self-pay

## 2023-06-14 ENCOUNTER — Encounter
Admission: RE | Disposition: A | Discharge status: Home / Self Care | Payer: Self-pay | Source: Home / Self Care | Attending: Ophthalmology

## 2023-06-14 ENCOUNTER — Ambulatory Visit: Payer: Managed Care, Other (non HMO) | Admitting: Anesthesiology

## 2023-06-14 ENCOUNTER — Ambulatory Visit
Admission: RE | Admit: 2023-06-14 | Discharge: 2023-06-14 | Disposition: A | Discharge status: Home / Self Care | Payer: Managed Care, Other (non HMO) | Attending: Ophthalmology | Admitting: Ophthalmology

## 2023-06-14 DIAGNOSIS — H2512 Age-related nuclear cataract, left eye: Secondary | ICD-10-CM | POA: Insufficient documentation

## 2023-06-14 DIAGNOSIS — J45909 Unspecified asthma, uncomplicated: Secondary | ICD-10-CM | POA: Insufficient documentation

## 2023-06-14 DIAGNOSIS — E1136 Type 2 diabetes mellitus with diabetic cataract: Secondary | ICD-10-CM | POA: Insufficient documentation

## 2023-06-14 DIAGNOSIS — G4730 Sleep apnea, unspecified: Secondary | ICD-10-CM | POA: Insufficient documentation

## 2023-06-14 DIAGNOSIS — Z794 Long term (current) use of insulin: Secondary | ICD-10-CM | POA: Insufficient documentation

## 2023-06-14 HISTORY — PX: CATARACT EXTRACTION W/PHACO: SHX586

## 2023-06-14 LAB — GLUCOSE, CAPILLARY: Glucose-Capillary: 191 mg/dL — ABNORMAL HIGH (ref 70–99)

## 2023-06-14 SURGERY — PHACOEMULSIFICATION, CATARACT, WITH IOL INSERTION
Anesthesia: Monitor Anesthesia Care | Site: Eye | Laterality: Left

## 2023-06-14 MED ORDER — ARMC OPHTHALMIC DILATING DROPS
1.0000 | OPHTHALMIC | Status: DC | PRN
  Administered 2023-06-14 (×2): 1 via OPHTHALMIC

## 2023-06-14 MED ORDER — TETRACAINE HCL 0.5 % OP SOLN
1.0000 [drp] | OPHTHALMIC | Status: DC | PRN
  Administered 2023-06-14 (×4): 1 [drp] via OPHTHALMIC

## 2023-06-14 MED ORDER — MOXIFLOXACIN HCL 0.5 % OP SOLN
OPHTHALMIC | Status: DC | PRN
  Administered 2023-06-14: .2 mL via OPHTHALMIC

## 2023-06-14 MED ORDER — GLYCOPYRROLATE 0.2 MG/ML IJ SOLN
INTRAMUSCULAR | Status: DC | PRN
  Administered 2023-06-14: .2 mg via INTRAVENOUS

## 2023-06-14 MED ORDER — SODIUM CHLORIDE 0.9% FLUSH
10.0000 mL | Freq: Two times a day (BID) | INTRAVENOUS | Status: DC
Start: 2023-06-14 — End: 2023-06-14

## 2023-06-14 MED ORDER — FENTANYL CITRATE (PF) 100 MCG/2ML IJ SOLN
INTRAMUSCULAR | Status: AC
  Filled 2023-06-14: qty 2

## 2023-06-14 MED ORDER — SIGHTPATH DOSE#1 BSS IO SOLN
INTRAOCULAR | Status: DC | PRN
  Administered 2023-06-14: 15 mL via INTRAOCULAR

## 2023-06-14 MED ORDER — MIDAZOLAM HCL 2 MG/2ML IJ SOLN
INTRAMUSCULAR | Status: AC
  Filled 2023-06-14: qty 2

## 2023-06-14 MED ORDER — SIGHTPATH DOSE#1 BSS IO SOLN
INTRAOCULAR | Status: DC | PRN
  Administered 2023-06-14: 57 mL via OPHTHALMIC

## 2023-06-14 MED ORDER — MIDAZOLAM HCL 2 MG/2ML IJ SOLN
INTRAMUSCULAR | Status: DC | PRN
  Administered 2023-06-14: 1 mg via INTRAVENOUS

## 2023-06-14 MED ORDER — TETRACAINE HCL 0.5 % OP SOLN
OPHTHALMIC | Status: AC
  Filled 2023-06-14: qty 4

## 2023-06-14 MED ORDER — SIGHTPATH DOSE#1 BSS IO SOLN
INTRAOCULAR | Status: DC | PRN
  Administered 2023-06-14: 2 mL

## 2023-06-14 MED ORDER — SIGHTPATH DOSE#1 NA CHONDROIT SULF-NA HYALURON 40-17 MG/ML IO SOLN
INTRAOCULAR | Status: DC | PRN
  Administered 2023-06-14: 1 mL via INTRAOCULAR

## 2023-06-14 MED ORDER — BRIMONIDINE TARTRATE-TIMOLOL 0.2-0.5 % OP SOLN
OPHTHALMIC | Status: DC | PRN
  Administered 2023-06-14: 1 [drp] via OPHTHALMIC

## 2023-06-14 SURGICAL SUPPLY — 12 items
ANGLE REVERSE CUT SHRT 25GA (CUTTER) ×1
CANNULA ANT/CHMB 27G (MISCELLANEOUS) IMPLANT
CANNULA ANT/CHMB 27GA (MISCELLANEOUS)
CATARACT SUITE SIGHTPATH (MISCELLANEOUS) ×1
CYSTOTOME ANGL RVRS SHRT 25G (CUTTER) ×1 IMPLANT
FEE CATARACT SUITE SIGHTPATH (MISCELLANEOUS) ×1 IMPLANT
GLOVE BIOGEL PI IND STRL 8 (GLOVE) ×1 IMPLANT
GLOVE SURG LX STRL 8.0 MICRO (GLOVE) ×1 IMPLANT
LENS IOL TECNIS EYHANCE 21.0 (Intraocular Lens) IMPLANT
NDL FILTER BLUNT 18X1 1/2 (NEEDLE) ×1 IMPLANT
NEEDLE FILTER BLUNT 18X1 1/2 (NEEDLE) ×1
SYR 3ML LL SCALE MARK (SYRINGE) ×1 IMPLANT

## 2023-06-14 NOTE — Anesthesia Preprocedure Evaluation (Addendum)
Anesthesia Evaluation  Patient identified by MRN, date of birth, ID band Patient awake    Reviewed: Allergy & Precautions, H&P , NPO status , Patient's Chart, lab work & pertinent test results, reviewed documented beta blocker date and time   Airway Mallampati: II  TM Distance: >3 FB Neck ROM: full    Dental no notable dental hx. (+) Teeth Intact   Pulmonary asthma , sleep apnea    Pulmonary exam normal breath sounds clear to auscultation       Cardiovascular Exercise Tolerance: Good hypertension, On Medications negative cardio ROS  Rhythm:regular Rate:Normal     Neuro/Psych  Neuromuscular disease  negative psych ROS   GI/Hepatic Neg liver ROS,GERD  Medicated,,  Endo/Other  diabetes    Renal/GU      Musculoskeletal   Abdominal   Peds  Hematology negative hematology ROS (+)   Anesthesia Other Findings   Reproductive/Obstetrics negative OB ROS                             Anesthesia Physical Anesthesia Plan  ASA: 3  Anesthesia Plan: MAC   Post-op Pain Management:    Induction:   PONV Risk Score and Plan:   Airway Management Planned:   Additional Equipment:   Intra-op Plan:   Post-operative Plan:   Informed Consent: I have reviewed the patients History and Physical, chart, labs and discussed the procedure including the risks, benefits and alternatives for the proposed anesthesia with the patient or authorized representative who has indicated his/her understanding and acceptance.       Plan Discussed with: CRNA  Anesthesia Plan Comments: (Pt had brief vagal episode.  She responded to head down and IV fluids.  Never hypotensive but brady to mid 40s recovering quickly.  She documents a history of doing this in the past..... Chart reviewed and pulm. Status ok with sats high 90s and blood suger 190s.  After iv fluid and discussion with patient, who desires to proceed, I think  this is reasonable with light sedation for cataract procedure.  Situation discussed with Porfilio and will proceed. Risks and benefits reviewed with patient and she accepts. ja)       Anesthesia Quick Evaluation

## 2023-06-14 NOTE — Op Note (Signed)
PREOPERATIVE DIAGNOSIS:  Nuclear sclerotic cataract of the left eye.   POSTOPERATIVE DIAGNOSIS:  Nuclear sclerotic cataract of the left eye.   OPERATIVE PROCEDURE:ORPROCALL@   SURGEON:  Galen Manila, MD.   ANESTHESIA:  Anesthesiologist: Yevette Edwards, MD CRNA: Lanell Matar, CRNA  1.      Managed anesthesia care. 2.     0.84ml of Shugarcaine was instilled following the paracentesis   COMPLICATIONS:  None.   TECHNIQUE:   Stop and chop   DESCRIPTION OF PROCEDURE:  The patient was examined and consented in the preoperative holding area where the aforementioned topical anesthesia was applied to the left eye and then brought back to the Operating Room where the left eye was prepped and draped in the usual sterile ophthalmic fashion and a lid speculum was placed. A paracentesis was created with the side port blade and the anterior chamber was filled with viscoelastic. A near clear corneal incision was performed with the steel keratome. A continuous curvilinear capsulorrhexis was performed with a cystotome followed by the capsulorrhexis forceps. Hydrodissection and hydrodelineation were carried out with BSS on a blunt cannula. The lens was removed in a stop and chop  technique and the remaining cortical material was removed with the irrigation-aspiration handpiece. The capsular bag was inflated with viscoelastic and the Technis [id number] lens was placed in the capsular bag without complication. The remaining viscoelastic was removed from the eye with the irrigation-aspiration handpiece. The wounds were hydrated. The anterior chamber was flushed with BSS and the eye was inflated to physiologic pressure. 0.32ml Vigamox was placed in the anterior chamber. The wounds were found to be water tight. The eye was dressed with Combigan. The patient was given protective glasses to wear throughout the day and a shield with which to sleep tonight. The patient was also given drops with which to begin a drop regimen  today and will follow-up with me in one day. Implant Name Type Inv. Item Serial No. Manufacturer Lot No. LRB No. Used Action  LENS IOL TECNIS EYHANCE 21.0 - [id number] Intraocular Lens LENS IOL TECNIS EYHANCE 21.0 [id number] [employer]  Left 1 Implanted    Procedure(s): CATARACT EXTRACTION PHACO AND INTRAOCULAR LENS PLACEMENT (IOC) LEFT DIABETIC 6.94 00:43.1 (Left)  Electronically signed: Galen Manila 06/14/2023 9:13 AM

## 2023-06-14 NOTE — H&P (Signed)
[facility]   Primary Care Physician:  Jacky Kindle, FNP Ophthalmologist: Dr. Maren Reamer  Pre-Procedure History & Physical: HPI:  Lindsey Hendricks is a 66 y.o. female here for cataract surgery.   Past Medical History:  Diagnosis Date   Arthritis    Asthma    Diabetes mellitus without complication (HCC)    GERD (gastroesophageal reflux disease)    Hyperlipidemia    Meniere disease    Sleep apnea    no CPAP    Past Surgical History:  Procedure Laterality Date   ABDOMINAL HYSTERECTOMY     ANKLE FRACTURE SURGERY Right    BREAST EXCISIONAL BIOPSY Right    @1988    CHOLECYSTECTOMY     TONSILLECTOMY     WRIST ARTHROSCOPY Left    Cyst     Prior to Admission medications   Medication Sig Start Date End Date Taking? Authorizing Provider  albuterol (VENTOLIN HFA) 108 (90 Base) MCG/ACT inhaler Inhale 2 puffs into the lungs every 6 (six) hours as needed for wheezing or shortness of breath.   Yes [provider]  buPROPion (WELLBUTRIN XL) 300 MG 24 hr tablet Take 1 tablet (300 mg total) by mouth daily. 12/24/22  Yes Jacky Kindle, FNP  Continuous Blood Gluc Sensor (DEXCOM G6 SENSOR) MISC Use to monitor blood sugar.  Replace every 10 days 12/27/18  Yes [provider]  Fluticasone-Umeclidin-Vilant (TRELEGY ELLIPTA) 100-62.5-25 MCG/ACT AEPB TAKE 1 PUFF BY MOUTH EVERY DAY 06/13/23  Yes Dgayli, Lianne Bushy, MD  HUMALOG 100 UNIT/ML injection SMARTSIG:0-90 Unit(s) SUB-Q Daily 07/05/21  Yes [provider]  Insulin Disposable Pump (OMNIPOD 5 G6 PODS, GEN 5,) MISC Inject into the skin. Inject 1 each subcutaneously every other day 10/11/22  Yes [provider]  loratadine (CLARITIN) 10 MG tablet Take 10 mg by mouth daily.   Yes [provider]  rosuvastatin (CRESTOR) 20 MG tablet Take 1 tablet (20 mg total) by mouth daily. 12/24/22  Yes Jacky Kindle, FNP    Allergies as of 05/30/2023   (No Known Allergies)    Family History  Problem Relation Age of  Onset   Lung cancer Father    Healthy Mother    Irregular heart beat Mother    COPD Sister    Congestive Heart Failure Maternal Grandmother    Congestive Heart Failure Maternal Grandfather     Social History   Socioeconomic History   Marital status: Married    Spouse name: Not on file   Number of children: Not on file   Years of education: Not on file   Highest education level: Not on file  Occupational History   Not on file  Tobacco Use   Smoking status: Never   Smokeless tobacco: Never  Vaping Use   Vaping status: Never Used  Substance and Sexual Activity   Alcohol use: No   Drug use: No   Sexual activity: Not on file  Other Topics Concern   Not on file  Social History Narrative   Not on file   Social Determinants of Health   Financial Resource Strain: Not on file  Food Insecurity: Not on file  Transportation Needs: Not on file  Physical Activity: Not on file  Stress: Not on file  Social Connections: Not on file  Intimate Partner Violence: Not on file    Review of Systems: See HPI, otherwise negative ROS  Physical Exam: BP 124/69   Temp (!) 97.3 F (36.3 C) (Temporal)   Resp 20  Ht 5' 4.02" (1.626 m)   Wt 89.9 kg   SpO2 100%   BMI 34.02 kg/m  General:   Alert, cooperative in NAD Head:  Normocephalic and atraumatic. Respiratory:  Normal work of breathing. Cardiovascular:  RRR  Impression/Plan: Lindsey Hendricks is here for cataract surgery.  Risks, benefits, limitations, and alternatives regarding cataract surgery have been reviewed with the patient.  Questions have been answered.  All parties agreeable.   Galen Manila, MD  06/14/2023, 8:52 AM

## 2023-06-14 NOTE — Anesthesia Postprocedure Evaluation (Signed)
Anesthesia Post Note  Patient: ANAISSA MCFIELD  Procedure(s) Performed: CATARACT EXTRACTION PHACO AND INTRAOCULAR LENS PLACEMENT (IOC) LEFT DIABETIC 6.94 00:43.1 (Left: Eye)  Patient location during evaluation: PACU Anesthesia Type: MAC Level of consciousness: awake and alert Pain management: pain level controlled Vital Signs Assessment: post-procedure vital signs reviewed and stable Respiratory status: spontaneous breathing, nonlabored ventilation, respiratory function stable and patient connected to nasal cannula oxygen Cardiovascular status: stable and blood pressure returned to baseline Postop Assessment: no apparent nausea or vomiting Anesthetic complications: no   No notable events documented.   Last Vitals:  Vitals:   06/14/23 0919 06/14/23 0920  BP: 139/89 139/89  Pulse:  99  Resp:  17  Temp: 36.7 C 36.7 C  SpO2:  95%    Last Pain:  Vitals:   06/14/23 0920  TempSrc:   PainSc: 0-No pain                 Yevette Edwards

## 2023-06-14 NOTE — Transfer of Care (Signed)
Immediate Anesthesia Transfer of Care Note  Patient: Lindsey Hendricks  Procedure(s) Performed: CATARACT EXTRACTION PHACO AND INTRAOCULAR LENS PLACEMENT (IOC) LEFT DIABETIC 6.94 00:43.1 (Left: Eye)  Patient Location: PACU  Anesthesia Type:MAC  Level of Consciousness: awake, alert , and oriented  Airway & Oxygen Therapy: Patient Spontanous Breathing  Post-op Assessment: Report given to RN, Post -op Vital signs reviewed and stable, and Patient moving all extremities X 4  Post vital signs: Reviewed and stable  Last Vitals:  Vitals Value Taken Time  BP    Temp 36.7 C 06/14/23 0914  Pulse 101 06/14/23 0915  Resp 21 06/14/23 0915  SpO2 96 % 06/14/23 0915  Vitals shown include unfiled device data.  Last Pain:  Vitals:   06/14/23 0749  TempSrc: Temporal  PainSc: 0-No pain         Complications: No notable events documented.

## 2023-06-20 ENCOUNTER — Encounter: Payer: Self-pay | Admitting: Ophthalmology

## 2023-06-22 NOTE — Anesthesia Preprocedure Evaluation (Addendum)
Anesthesia Evaluation  Patient identified by MRN, date of birth, ID band Patient awake    Reviewed: Allergy & Precautions, H&P , NPO status , Patient's Chart, lab work & pertinent test results  Airway Mallampati: II  TM Distance: >3 FB Neck ROM: Full    Dental no notable dental hx.    Pulmonary neg pulmonary ROS, asthma , sleep apnea    Pulmonary exam normal breath sounds clear to auscultation       Cardiovascular hypertension, negative cardio ROS Normal cardiovascular exam Rhythm:Regular Rate:Normal     Neuro/Psych  PSYCHIATRIC DISORDERS  Depression     Neuromuscular disease negative neurological ROS  negative psych ROS   GI/Hepatic negative GI ROS, Neg liver ROS,GERD  ,,  Endo/Other  diabetes    Renal/GU negative Renal ROS  negative genitourinary   Musculoskeletal negative musculoskeletal ROS (+) Arthritis ,    Abdominal   Peds negative pediatric ROS (+)  Hematology negative hematology ROS (+)   Anesthesia Other Findings Previous cataract surgery 06-14-23 Dr. Pernell Dupre anesthesiologist, [facility] CRNA, glycopyrrolate 0.2 mg IV and versed 1 mg IV previously  Diabetes mellitus without complication  Hyperlipidemia Meniere disease  Sleep apnea GERD (gastroesophageal reflux disease)  Asthma Arthritis     Reproductive/Obstetrics negative OB ROS                              Anesthesia Physical Anesthesia Plan  ASA: 3  Anesthesia Plan: MAC   Post-op Pain Management:    Induction: Intravenous  PONV Risk Score and Plan:   Airway Management Planned: Natural Airway and Nasal Cannula  Additional Equipment:   Intra-op Plan:   Post-operative Plan:   Informed Consent: I have reviewed the patients History and Physical, chart, labs and discussed the procedure including the risks, benefits and alternatives for the proposed anesthesia with the patient or authorized representative who  has indicated his/her understanding and acceptance.     Dental Advisory Given  Plan Discussed with: Anesthesiologist, CRNA and Surgeon  Anesthesia Plan Comments: (Patient consented for risks of anesthesia including but not limited to:  - adverse reactions to medications - damage to eyes, teeth, lips or other oral mucosa - nerve damage due to positioning  - sore throat or hoarseness - Damage to heart, brain, nerves, lungs, other parts of body or loss of life  Patient voiced understanding and assent.)         Anesthesia Quick Evaluation

## 2023-06-23 NOTE — Discharge Instructions (Signed)

## 2023-06-28 ENCOUNTER — Other Ambulatory Visit: Payer: Self-pay

## 2023-06-28 ENCOUNTER — Encounter
Admission: RE | Disposition: A | Discharge status: Home / Self Care | Payer: Self-pay | Source: Home / Self Care | Attending: Ophthalmology

## 2023-06-28 ENCOUNTER — Ambulatory Visit: Payer: Managed Care, Other (non HMO) | Admitting: Anesthesiology

## 2023-06-28 ENCOUNTER — Ambulatory Visit
Admission: RE | Admit: 2023-06-28 | Discharge: 2023-06-28 | Disposition: A | Discharge status: Home / Self Care | Payer: Managed Care, Other (non HMO) | Attending: Ophthalmology | Admitting: Ophthalmology

## 2023-06-28 DIAGNOSIS — I10 Essential (primary) hypertension: Secondary | ICD-10-CM | POA: Insufficient documentation

## 2023-06-28 DIAGNOSIS — E1136 Type 2 diabetes mellitus with diabetic cataract: Secondary | ICD-10-CM | POA: Diagnosis not present

## 2023-06-28 DIAGNOSIS — H2511 Age-related nuclear cataract, right eye: Secondary | ICD-10-CM | POA: Insufficient documentation

## 2023-06-28 DIAGNOSIS — J45909 Unspecified asthma, uncomplicated: Secondary | ICD-10-CM | POA: Diagnosis not present

## 2023-06-28 DIAGNOSIS — G4730 Sleep apnea, unspecified: Secondary | ICD-10-CM | POA: Insufficient documentation

## 2023-06-28 DIAGNOSIS — K219 Gastro-esophageal reflux disease without esophagitis: Secondary | ICD-10-CM | POA: Diagnosis not present

## 2023-06-28 DIAGNOSIS — Z794 Long term (current) use of insulin: Secondary | ICD-10-CM | POA: Diagnosis not present

## 2023-06-28 HISTORY — DX: Sleep apnea, unspecified: G47.30

## 2023-06-28 HISTORY — DX: Gastro-esophageal reflux disease without esophagitis: K21.9

## 2023-06-28 HISTORY — DX: Unspecified asthma, uncomplicated: J45.909

## 2023-06-28 HISTORY — PX: CATARACT EXTRACTION W/PHACO: SHX586

## 2023-06-28 HISTORY — DX: Unspecified osteoarthritis, unspecified site: M19.90

## 2023-06-28 LAB — GLUCOSE, CAPILLARY: Glucose-Capillary: 99 mg/dL (ref 70–99)

## 2023-06-28 SURGERY — PHACOEMULSIFICATION, CATARACT, WITH IOL INSERTION
Anesthesia: Monitor Anesthesia Care | Laterality: Right

## 2023-06-28 MED ORDER — MIDAZOLAM HCL 2 MG/2ML IJ SOLN
INTRAMUSCULAR | Status: AC
Start: 2023-06-28 — End: ?
  Filled 2023-06-28: qty 2

## 2023-06-28 MED ORDER — SIGHTPATH DOSE#1 BSS IO SOLN
INTRAOCULAR | Status: DC | PRN
  Administered 2023-06-28: 2 mL

## 2023-06-28 MED ORDER — MIDAZOLAM HCL 2 MG/2ML IJ SOLN
INTRAMUSCULAR | Status: DC | PRN
  Administered 2023-06-28: 1 mg via INTRAVENOUS

## 2023-06-28 MED ORDER — BRIMONIDINE TARTRATE-TIMOLOL 0.2-0.5 % OP SOLN
OPHTHALMIC | Status: DC | PRN
  Administered 2023-06-28: 1 [drp] via OPHTHALMIC

## 2023-06-28 MED ORDER — TETRACAINE HCL 0.5 % OP SOLN
OPHTHALMIC | Status: AC
Start: 2023-06-28 — End: ?
  Filled 2023-06-28: qty 4

## 2023-06-28 MED ORDER — FENTANYL CITRATE (PF) 100 MCG/2ML IJ SOLN
INTRAMUSCULAR | Status: AC
  Filled 2023-06-28: qty 2

## 2023-06-28 MED ORDER — SIGHTPATH DOSE#1 BSS IO SOLN
INTRAOCULAR | Status: DC | PRN
  Administered 2023-06-28: 15 mL via INTRAOCULAR

## 2023-06-28 MED ORDER — SIGHTPATH DOSE#1 BSS IO SOLN
INTRAOCULAR | Status: DC | PRN
  Administered 2023-06-28: 43 mL via OPHTHALMIC

## 2023-06-28 MED ORDER — ARMC OPHTHALMIC DILATING DROPS
1.0000 | OPHTHALMIC | Status: DC | PRN
  Administered 2023-06-28 (×3): 1 via OPHTHALMIC

## 2023-06-28 MED ORDER — SIGHTPATH DOSE#1 NA CHONDROIT SULF-NA HYALURON 40-17 MG/ML IO SOLN
INTRAOCULAR | Status: DC | PRN
  Administered 2023-06-28: 1 mL via INTRAOCULAR

## 2023-06-28 MED ORDER — MOXIFLOXACIN HCL 0.5 % OP SOLN
OPHTHALMIC | Status: DC | PRN
  Administered 2023-06-28: .2 mL via OPHTHALMIC

## 2023-06-28 MED ORDER — TETRACAINE HCL 0.5 % OP SOLN
1.0000 [drp] | OPHTHALMIC | Status: DC | PRN
  Administered 2023-06-28 (×3): 1 [drp] via OPHTHALMIC

## 2023-06-28 MED ORDER — SEVOFLURANE IN SOLN
RESPIRATORY_TRACT | Status: AC
  Filled 2023-06-28: qty 250

## 2023-06-28 SURGICAL SUPPLY — 10 items
ANGLE REVERSE CUT SHRT 25GA (CUTTER) ×1
CATARACT SUITE SIGHTPATH (MISCELLANEOUS) ×1
CYSTOTOME ANGL RVRS SHRT 25G (CUTTER) ×1 IMPLANT
FEE CATARACT SUITE SIGHTPATH (MISCELLANEOUS) ×1 IMPLANT
GLOVE BIOGEL PI IND STRL 8 (GLOVE) ×1 IMPLANT
GLOVE SURG LX STRL 8.0 MICRO (GLOVE) ×1 IMPLANT
LENS IOL TECNIS EYHANCE 21.0 (Intraocular Lens) IMPLANT
NDL FILTER BLUNT 18X1 1/2 (NEEDLE) ×1 IMPLANT
NEEDLE FILTER BLUNT 18X1 1/2 (NEEDLE) ×1
SYR 3ML LL SCALE MARK (SYRINGE) ×1 IMPLANT

## 2023-06-28 NOTE — Op Note (Signed)
PREOPERATIVE DIAGNOSIS:  Nuclear sclerotic cataract of the right eye.   POSTOPERATIVE DIAGNOSIS:  Cataract   OPERATIVE PROCEDURE:ORPROCALL@   SURGEON:  Galen Manila, MD.   ANESTHESIA:  Anesthesiologist: Marisue Humble, MD CRNA: Barbette Hair, CRNA  1.      Managed anesthesia care. 2.      0.80ml of Shugarcaine was instilled in the eye following the paracentesis.   COMPLICATIONS:  None.   TECHNIQUE:   Stop and chop   DESCRIPTION OF PROCEDURE:  The patient was examined and consented in the preoperative holding area where the aforementioned topical anesthesia was applied to the right eye and then brought back to the Operating Room where the right eye was prepped and draped in the usual sterile ophthalmic fashion and a lid speculum was placed. A paracentesis was created with the side port blade and the anterior chamber was filled with viscoelastic. A near clear corneal incision was performed with the steel keratome. A continuous curvilinear capsulorrhexis was performed with a cystotome followed by the capsulorrhexis forceps. Hydrodissection and hydrodelineation were carried out with BSS on a blunt cannula. The lens was removed in a stop and chop  technique and the remaining cortical material was removed with the irrigation-aspiration handpiece. The capsular bag was inflated with viscoelastic and the Technis [id number]  lens was placed in the capsular bag without complication. The remaining viscoelastic was removed from the eye with the irrigation-aspiration handpiece. The wounds were hydrated. The anterior chamber was flushed with BSS and the eye was inflated to physiologic pressure. 0.21ml of Vigamox was placed in the anterior chamber. The wounds were found to be water tight. The eye was dressed with Combigan. The patient was given protective glasses to wear throughout the day and a shield with which to sleep tonight. The patient was also given drops with which to begin a drop regimen today and will  follow-up with me in one day. Implant Name Type Inv. Item Serial No. Manufacturer Lot No. LRB No. Used Action  LENS IOL TECNIS EYHANCE 21.0 - [id number] Intraocular Lens LENS IOL TECNIS EYHANCE 21.0 [id number] [employer]  Right 1 Implanted   Procedure(s): CATARACT EXTRACTION PHACO AND INTRAOCULAR LENS PLACEMENT (IOC) RIGHT DIABETIC 5.27 00:43.6 (Right)  Electronically signed: Galen Manila 06/28/2023 7:46 AM

## 2023-06-28 NOTE — H&P (Signed)
[facility]   Primary Care Physician:  Jacky Kindle, FNP Ophthalmologist: Dr. Maren Reamer  Pre-Procedure History & Physical: HPI:  Lindsey Hendricks is a 67 y.o. female here for cataract surgery.   Past Medical History:  Diagnosis Date   Arthritis    Asthma    Diabetes mellitus without complication (HCC)    GERD (gastroesophageal reflux disease)    Hyperlipidemia    Meniere disease    Sleep apnea    no CPAP    Past Surgical History:  Procedure Laterality Date   ABDOMINAL HYSTERECTOMY     ANKLE FRACTURE SURGERY Right    BREAST EXCISIONAL BIOPSY Right    @1988    CATARACT EXTRACTION W/PHACO Left 06/14/2023   Procedure: CATARACT EXTRACTION PHACO AND INTRAOCULAR LENS PLACEMENT (IOC) LEFT DIABETIC 6.94 00:43.1;  Surgeon: Galen Manila, MD;  Location: [facility];  Service: Ophthalmology;  Laterality: Left;   CHOLECYSTECTOMY     TONSILLECTOMY     WRIST ARTHROSCOPY Left    Cyst     Prior to Admission medications   Medication Sig Start Date End Date Taking? Authorizing Provider  albuterol (VENTOLIN HFA) 108 (90 Base) MCG/ACT inhaler Inhale 2 puffs into the lungs every 6 (six) hours as needed for wheezing or shortness of breath.   Yes [provider]  buPROPion (WELLBUTRIN XL) 300 MG 24 hr tablet Take 1 tablet (300 mg total) by mouth daily. 12/24/22  Yes Jacky Kindle, FNP  HUMALOG 100 UNIT/ML injection SMARTSIG:0-90 Unit(s) SUB-Q Daily 07/05/21  Yes [provider]  loratadine (CLARITIN) 10 MG tablet Take 10 mg by mouth daily.   Yes [provider]  rosuvastatin (CRESTOR) 20 MG tablet Take 1 tablet (20 mg total) by mouth daily. 12/24/22  Yes Jacky Kindle, FNP  Continuous Blood Gluc Sensor (DEXCOM G6 SENSOR) MISC Use to monitor blood sugar.  Replace every 10 days 12/27/18   [provider]  Fluticasone-Umeclidin-Vilant (TRELEGY ELLIPTA) 100-62.5-25 MCG/ACT AEPB TAKE 1 PUFF BY MOUTH EVERY DAY Patient not taking: Reported on  06/28/2023 06/13/23   Raechel Chute, MD  Insulin Disposable Pump (OMNIPOD 5 G6 PODS, GEN 5,) MISC Inject into the skin. Inject 1 each subcutaneously every other day 10/11/22   [provider]    Allergies as of 05/30/2023   (No Known Allergies)    Family History  Problem Relation Age of Onset   Lung cancer Father    Healthy Mother    Irregular heart beat Mother    COPD Sister    Congestive Heart Failure Maternal Grandmother    Congestive Heart Failure Maternal Grandfather     Social History   Socioeconomic History   Marital status: Married    Spouse name: Not on file   Number of children: Not on file   Years of education: Not on file   Highest education level: Not on file  Occupational History   Not on file  Tobacco Use   Smoking status: Never   Smokeless tobacco: Never  Vaping Use   Vaping status: Never Used  Substance and Sexual Activity   Alcohol use: No   Drug use: No   Sexual activity: Not on file  Other Topics Concern   Not on file  Social History Narrative   Not on file   Social Drivers of Health   Financial Resource Strain: Not on file  Food Insecurity: Not on file  Transportation Needs: Not on file  Physical Activity: Not on file  Stress: Not on  file  Social Connections: Not on file  Intimate Partner Violence: Not on file    Review of Systems: See HPI, otherwise negative ROS  Physical Exam: BP 134/69   Pulse 94   Temp (!) 97.4 F (36.3 C) (Temporal)   Resp 16   Ht 5' 4.02" (1.626 m)   Wt 88.9 kg   SpO2 94%   BMI 33.62 kg/m  General:   Alert, cooperative in NAD Head:  Normocephalic and atraumatic. Respiratory:  Normal work of breathing. Cardiovascular:  RRR  Impression/Plan: Lindsey Hendricks is here for cataract surgery.  Risks, benefits, limitations, and alternatives regarding cataract surgery have been reviewed with the patient.  Questions have been answered.  All parties agreeable.   Galen Manila, MD  06/28/2023, 7:16  AM

## 2023-06-28 NOTE — Transfer of Care (Signed)
Immediate Anesthesia Transfer of Care Note  Patient: Lindsey Hendricks  Procedure(s) Performed: CATARACT EXTRACTION PHACO AND INTRAOCULAR LENS PLACEMENT (IOC) RIGHT DIABETIC 5.27 00:43.6 (Right)  Patient Location: PACU  Anesthesia Type: MAC  Level of Consciousness: awake, alert  and patient cooperative  Airway and Oxygen Therapy: Patient Spontanous Breathing and Patient connected to supplemental oxygen  Post-op Assessment: Post-op Vital signs reviewed, Patient's Cardiovascular Status Stable, Respiratory Function Stable, Patent Airway and No signs of Nausea or vomiting  Post-op Vital Signs: Reviewed and stable  Complications: No notable events documented.

## 2023-06-28 NOTE — Anesthesia Postprocedure Evaluation (Signed)
Anesthesia Post Note  Patient: Lindsey Hendricks  Procedure(s) Performed: CATARACT EXTRACTION PHACO AND INTRAOCULAR LENS PLACEMENT (IOC) RIGHT DIABETIC 5.27 00:43.6 (Right)  Patient location during evaluation: PACU Anesthesia Type: MAC Level of consciousness: awake and alert Pain management: pain level controlled Vital Signs Assessment: post-procedure vital signs reviewed and stable Respiratory status: spontaneous breathing, nonlabored ventilation, respiratory function stable and patient connected to nasal cannula oxygen Cardiovascular status: stable and blood pressure returned to baseline Postop Assessment: no apparent nausea or vomiting Anesthetic complications: no   No notable events documented.   Last Vitals:  Vitals:   06/28/23 0637 06/28/23 0749  BP: 134/69   Pulse: 94   Resp: 16   Temp: (!) 36.3 C (!) 36.3 C  SpO2: 94%     Last Pain:  Vitals:   06/28/23 0755  TempSrc:   PainSc: 0-No pain                 Ikeya Brockel C Kayvan Hoefling

## 2023-06-29 ENCOUNTER — Encounter: Payer: Self-pay | Admitting: Ophthalmology

## 2024-04-03 ENCOUNTER — Ambulatory Visit

## 2024-04-03 DIAGNOSIS — Z23 Encounter for immunization: Secondary | ICD-10-CM | POA: Diagnosis not present

## 2024-04-03 NOTE — Patient Instructions (Signed)
 Right arm influenza vaccine administered by S. Cooper CMA

## 2024-08-13 LAB — OPHTHALMOLOGY REPORT-SCANNED
